# Patient Record
Sex: Male | Born: 1995 | Race: White | Hispanic: No | Marital: Single | Smoking: Current some day smoker
Health system: Southern US, Community
[De-identification: ages and names within clinical notes are randomized; demographics above are authoritative.]

## PROBLEM LIST (undated history)

## (undated) DIAGNOSIS — Z87442 Personal history of urinary calculi: Secondary | ICD-10-CM

## (undated) DIAGNOSIS — R3129 Other microscopic hematuria: Secondary | ICD-10-CM

## (undated) DIAGNOSIS — N451 Epididymitis: Secondary | ICD-10-CM

## (undated) HISTORY — DX: Epididymitis: N45.1

## (undated) HISTORY — DX: Personal history of urinary calculi: Z87.442

---
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
## 2014-02-03 ENCOUNTER — Emergency Department: Payer: Self-pay | Admitting: Emergency Medicine

## 2014-02-03 LAB — URINALYSIS, COMPLETE
Bacteria: NONE SEEN
Bilirubin,UR: NEGATIVE
Glucose,UR: NEGATIVE mg/dL (ref 0–75)
Ketone: NEGATIVE
Leukocyte Esterase: NEGATIVE
Nitrite: NEGATIVE
PROTEIN: NEGATIVE
Ph: 6 (ref 4.5–8.0)
RBC,UR: 16 /HPF (ref 0–5)
SPECIFIC GRAVITY: 1.019 (ref 1.003–1.030)

## 2014-08-19 ENCOUNTER — Encounter: Payer: Self-pay | Admitting: *Deleted

## 2014-08-19 ENCOUNTER — Ambulatory Visit
Admission: RE | Admit: 2014-08-19 | Discharge: 2014-08-19 | Disposition: A | Discharge status: Home / Self Care | Payer: BLUE CROSS/BLUE SHIELD | Source: Ambulatory Visit | Attending: Urology | Admitting: Urology

## 2014-08-19 ENCOUNTER — Ambulatory Visit (INDEPENDENT_AMBULATORY_CARE_PROVIDER_SITE_OTHER): Payer: BLUE CROSS/BLUE SHIELD | Admitting: Urology

## 2014-08-19 ENCOUNTER — Telehealth: Payer: Self-pay | Admitting: Urology

## 2014-08-19 VITALS — BP 97/69 | HR 81 | Ht 66.0 in | Wt 166.3 lb

## 2014-08-19 DIAGNOSIS — R109 Unspecified abdominal pain: Secondary | ICD-10-CM | POA: Insufficient documentation

## 2014-08-19 DIAGNOSIS — R3129 Other microscopic hematuria: Secondary | ICD-10-CM

## 2014-08-19 DIAGNOSIS — Z87442 Personal history of urinary calculi: Secondary | ICD-10-CM | POA: Diagnosis not present

## 2014-08-19 DIAGNOSIS — R312 Other microscopic hematuria: Secondary | ICD-10-CM | POA: Diagnosis not present

## 2014-08-19 DIAGNOSIS — N200 Calculus of kidney: Secondary | ICD-10-CM | POA: Insufficient documentation

## 2014-08-19 LAB — URINALYSIS, COMPLETE
Bilirubin, UA: NEGATIVE
GLUCOSE, UA: NEGATIVE
KETONES UA: NEGATIVE
LEUKOCYTES UA: NEGATIVE
Nitrite, UA: NEGATIVE
PROTEIN UA: NEGATIVE
Specific Gravity, UA: 1.02 (ref 1.005–1.030)
UUROB: 0.2 mg/dL (ref 0.2–1.0)
pH, UA: 7 (ref 5.0–7.5)

## 2014-08-19 LAB — MICROSCOPIC EXAMINATION: BACTERIA UA: NONE SEEN

## 2014-08-19 MED ORDER — TAMSULOSIN HCL 0.4 MG PO CAPS: 0.4000 mg | ORAL_CAPSULE | Freq: Every day | ORAL | Status: DC

## 2014-08-19 NOTE — Telephone Encounter (Signed)
Please call patient and tell him his KUB is negative for stone.  Please schedule RUS.

## 2014-08-19 NOTE — Progress Notes (Signed)
08/19/2014 1:00 PM   DOCTOR SHEAHAN 1995/10/04 [id number]  Referring provider: No referring provider defined for this encounter.  Chief Complaint  Patient presents with  . Nephrolithiasis    3 day pain right flank, vomitting    HPI: Mr. Isaiah Tucker is a 19 y/o white male with a h/o kidney stones who presents today after a two day hx of right flank pain and intermittent vomiting.  Patient states he was driving back from [facility] on Saturday (08/18/2014) and had the sudden onset of right flank pain.  The pain was so intense that he had to pull over to vomit.  The rest of the day he had flank pain 7 times, lasting 20-30 minutes at a time with associated vomiting.  Yesterday, he had flank pain 3-4 times, with the flank pain lasted 20-30 minutes.  He vomited twice.  He denies any gross hematuria, but he states he is always told he has blood in his urine.  His urine is orange today.  His UA has 11-30 RBC's per hpf today.  His pain does not radiate.  He has h/o stones, but he has not sought urological evaluation in the past.  His father has passed about thirty stones.  He has not had any associated fevers, chills, nausea or vomiting.     PMH: Past Medical History  Diagnosis Date  . History of kidney stones   . Epididymitis     Surgical History: No past surgical history on file.  Home Medications:    Medication List       This list is accurate as of: 08/19/14  1:00 PM.  Always use your most recent med list.               ketorolac 10 MG tablet  Commonly known as:  TORADOL  Take 10 mg by mouth every 6 (six) hours as needed.     tamsulosin 0.4 MG Caps capsule  Commonly known as:  FLOMAX  Take 1 capsule (0.4 mg total) by mouth daily.        Allergies: No Known Allergies  Family History: Family History  Problem Relation Age of Onset  . Kidney Stones Father     Social History:  reports that he has quit smoking. He does not have any smokeless tobacco history on file. He  reports that he drinks alcohol. He reports that he does not use illicit drugs.  ROS: Urological Symptom Review  Patient is experiencing the following symptoms: Burning/pain with urination Blood in urine   Review of Systems  Gastrointestinal (upper)  : Vomiting  Gastrointestinal (lower) : Negative for lower GI symptoms  Constitutional : Negative for symptoms  Skin: Negative for skin symptoms  Eyes: Negative for eye symptoms  Ear/Nose/Throat : Negative for Ear/Nose/Throat symptoms  Hematologic/Lymphatic: Negative for Hematologic/Lymphatic symptoms  Cardiovascular : Negative for cardiovascular symptoms  Respiratory : Negative for respiratory symptoms  Endocrine: Negative for endocrine symptoms  Musculoskeletal: Back pain  Neurological: Negative for neurological symptoms  Psychologic: Negative for psychiatric symptoms   Physical Exam: BP 97/69 mmHg  Pulse 81  Ht _0  (1.676 m)  Wt 166 lb 4.8 oz (75.433 kg)  BMI 26.85 kg/m2  Constitutional:  Alert and oriented, No acute distress. HEENT: [place] AT, moist mucus membranes.  Trachea midline, no masses. Cardiovascular: No clubbing, cyanosis, or edema. Respiratory: Normal respiratory effort, no increased work of breathing. GI: Abdomen is soft, nontender, nondistended, no abdominal masses GU: No CVA tenderness. GU: Patient with circumcised phallus.  Urethral meatus is patent.  No penile discharge. No penile lesions or rashes. Scrotum without lesions, cysts, rashes and/or edema.  Testicles are located scrotally bilaterally. No masses are appreciated in the testicles. Left and right epididymis are normal. Skin: No rashes, bruises or suspicious lesions. Lymph: No cervical or inguinal adenopathy. Neurologic: Grossly intact, no focal deficits, moving all 4 extremities. Psychiatric: Normal mood and affect.  Laboratory Data: Results for orders placed or performed in visit on 08/19/14  Microscopic Examination  Result  Value Ref Range   WBC, UA 0-5 0 -  5 /hpf   RBC, UA 11-30 (A) 0 -  2 /hpf   Epithelial Cells (non renal) 0-10 0 - 10 /hpf   Mucus, UA Present (A) Not Estab.   Bacteria, UA None seen None seen/Few  Urinalysis, Complete  Result Value Ref Range   Specific Gravity, UA 1.020 1.005 - 1.030   pH, UA 7.0 5.0 - 7.5   Color, UA Yellow Yellow   Appearance Ur Clear Clear   Leukocytes, UA Negative Negative   Protein, UA Negative Negative/Trace   Glucose, UA Negative Negative   Ketones, UA Negative Negative   RBC, UA 2+ (A) Negative   Bilirubin, UA Negative Negative   Urobilinogen, Ur 0.2 0.2 - 1.0 mg/dL   Nitrite, UA Negative Negative   Microscopic Examination See below:     No results found for: WBC, HGB, HCT, MCV, PLT  No results found for: CREATININE  No results found for: PSA  No results found for: TESTOSTERONE  No results found for: HGBA1C  Urinalysis No results found for: COLORURINE, APPEARANCEUR, LABSPEC, PHURINE, GLUCOSEU, HGBUR, BILIRUBINUR, KETONESUR, PROTEINUR, UROBILINOGEN, NITRITE, LEUKOCYTESUR  Pertinent Imaging: CLINICAL DATA: 19 year old male with right back pain for 1 week. History kidney stones. Initial encounter.  EXAM: ABDOMEN - 1 VIEW  COMPARISON: None.  FINDINGS: No discrete renal or ureteral calculi is noted. Overlying stool slightly limits evaluation.  Minimal curvature lumbar spine convex to the right.  Normal bowel gas pattern.  IMPRESSION: No discrete renal or ureteral calculi is noted. Overlying stool slightly limits evaluation.   Electronically Signed  By: Genia Del M.D.  On: 08/19/2014 13:31  Assessment & Plan:    1. Flank pain-  Patient with the sudden onset of right flank pain with a h/o stones.  He will have a KUB today and I will call him with the results. I have also prescribed him tamsulosin 0.4 mg one capsule daily to assist with MET and a strainer and encouraged him to strain his urine and to bring in any  fragments he may pass.  If KUB is negative, we will schedule a RUS to look for hydronephrosis.    2.  Microscopic hematuria-  Patient had 11-30 RBC's/hpf on today's UA associated with flank pain.  We will continue to monitor his UA to make sure the micro heme resolved after he has passed his stone.    3. History of stones-  Patient would benefit from a metabolic work up after this stone has passed due to his h/o of stones and his father's h/o stones.     - Urinalysis, Complete - CULTURE, URINE COMPREHENSIVE   No Follow-up on file.  Zara Council, [facility] [address] [place], [place] [address] [phone]

## 2014-08-21 LAB — CULTURE, URINE COMPREHENSIVE

## 2014-08-22 NOTE — Telephone Encounter (Signed)
Unable to reach pt via telephone. Letter was sent in reference to needing RUS. Cw,lpn

## 2014-09-03 ENCOUNTER — Other Ambulatory Visit: Payer: Self-pay

## 2014-09-03 DIAGNOSIS — N200 Calculus of kidney: Secondary | ICD-10-CM

## 2014-09-05 ENCOUNTER — Other Ambulatory Visit: Payer: Self-pay | Admitting: Family Medicine

## 2014-09-05 DIAGNOSIS — R109 Unspecified abdominal pain: Secondary | ICD-10-CM

## 2014-09-18 ENCOUNTER — Ambulatory Visit
Admission: RE | Admit: 2014-09-18 | Discharge: 2014-09-18 | Disposition: A | Discharge status: Home / Self Care | Payer: BLUE CROSS/BLUE SHIELD | Source: Ambulatory Visit | Attending: Urology | Admitting: Urology

## 2014-09-18 DIAGNOSIS — N200 Calculus of kidney: Secondary | ICD-10-CM | POA: Diagnosis not present

## 2014-09-18 DIAGNOSIS — R109 Unspecified abdominal pain: Secondary | ICD-10-CM | POA: Diagnosis present

## 2014-09-20 ENCOUNTER — Telehealth: Payer: Self-pay

## 2014-09-20 DIAGNOSIS — N200 Calculus of kidney: Secondary | ICD-10-CM

## 2014-09-20 NOTE — Telephone Encounter (Signed)
-----   Message from Vanna ScotlandAshley Brandon, MD sent at 09/18/2014  5:28 PM EDT ----- Please let patient know that he has 6 mm left nonobstructing stone.  This should no be causing pain but could possibly be treated to avoid stone episodes in the future.  Please either arrange follow up in the next few weeks vs. 1 year with KUB to monitor.    Vanna ScotlandAshley Brandon, MD

## 2014-09-20 NOTE — Telephone Encounter (Signed)
Spoke with pt who stated he would like to have stone treated. Pt was transferred to the front to make f/u appt. KUB orders were placed. Cw,lpn

## 2014-10-03 ENCOUNTER — Ambulatory Visit
Admission: RE | Admit: 2014-10-03 | Discharge: 2014-10-03 | Disposition: A | Discharge status: Home / Self Care | Payer: BLUE CROSS/BLUE SHIELD | Source: Ambulatory Visit | Attending: Urology | Admitting: Urology

## 2014-10-03 ENCOUNTER — Ambulatory Visit (INDEPENDENT_AMBULATORY_CARE_PROVIDER_SITE_OTHER): Payer: BLUE CROSS/BLUE SHIELD | Admitting: Urology

## 2014-10-03 ENCOUNTER — Encounter: Payer: Self-pay | Admitting: Urology

## 2014-10-03 VITALS — BP 105/72 | HR 65 | Ht 66.0 in | Wt 163.9 lb

## 2014-10-03 DIAGNOSIS — N200 Calculus of kidney: Secondary | ICD-10-CM

## 2014-10-03 DIAGNOSIS — R109 Unspecified abdominal pain: Secondary | ICD-10-CM | POA: Diagnosis not present

## 2014-10-03 DIAGNOSIS — R312 Other microscopic hematuria: Secondary | ICD-10-CM

## 2014-10-03 DIAGNOSIS — R3129 Other microscopic hematuria: Secondary | ICD-10-CM

## 2014-10-03 LAB — URINALYSIS, COMPLETE
Bilirubin, UA: NEGATIVE
Glucose, UA: NEGATIVE
Ketones, UA: NEGATIVE
LEUKOCYTES UA: NEGATIVE
Nitrite, UA: NEGATIVE
PH UA: 6 (ref 5.0–7.5)
Protein, UA: NEGATIVE
Specific Gravity, UA: 1.025 (ref 1.005–1.030)
Urobilinogen, Ur: 0.2 mg/dL (ref 0.2–1.0)

## 2014-10-03 LAB — MICROSCOPIC EXAMINATION
Bacteria, UA: NONE SEEN
Epithelial Cells (non renal): NONE SEEN /hpf (ref 0–10)

## 2014-10-03 NOTE — Progress Notes (Signed)
11:21 PM   Isaiah Tucker 1995-10-29 [id number]  Referring provider: No referring provider defined for this encounter.  Chief Complaint  Patient presents with  . Nephrolithiasis    Follow up- patient has no complains/symptoms       HPI: Isaiah Tucker is a 89 year white male with a h/o nephrolithiasis who presents today to discuss the results of his renal ultrasound.    Today, he is without complaint.  He denies any flank pain, hematuria or dysuria.  He also denies any fevers, chills, nausea or vomiting.  RUS demonstrate a 6 mm right renal stone.  This stone was not visible on the KUB dated 08/19/2014.  I have reviewed the films with the patient.  Previous History:  Patient states he was driving back from [place] on Saturday (08/18/2014) and had the sudden onset of right flank pain.  The pain was so intense that he had to pull over to vomit.  The rest of the day he had flank pain 7 times, lasting 20-30 minutes at a time with associated vomiting.  Yesterday, he had flank pain 3-4 times, with the flank pain lasted 20-30 minutes.  He vomited twice.  He denies any gross hematuria, but he states he is always told he has blood in his urine.  His UA on 08/19/2014 had 11-30 RBC's per hpf today.  His pain does not radiate.  He has h/o stones, but he has not sought urological evaluation in the past.  His father has passed about thirty stones.  He has not had any associated fevers, chills, nausea or vomiting.  KUB did not demonstrate any renal or ureteral calculus.      PMH: Past Medical History  Diagnosis Date  . History of kidney stones   . Epididymitis     Surgical History: No past surgical history on file.  Home Medications:    Medication List       This list is accurate as of: 10/03/14 11:21 PM.  Always use your most recent med list.               ketorolac 10 MG tablet  Commonly known as:  TORADOL  Take 10 mg by mouth every 6 (six) hours as needed.     tamsulosin 0.4 MG  Caps capsule  Commonly known as:  FLOMAX  Take 1 capsule (0.4 mg total) by mouth daily.        Allergies: No Known Allergies  Family History: Family History  Problem Relation Age of Onset  . Kidney Stones Father     Social History:  reports that he has been smoking.  He has never used smokeless tobacco. He reports that he drinks alcohol. He reports that he does not use illicit drugs.  ROS: Urological Symptom Review  Patient is experiencing the following symptoms: Burning/pain with urination Blood in urine   Review of Systems  Gastrointestinal (upper)  : Vomiting  Gastrointestinal (lower) : Negative for lower GI symptoms  Constitutional : Negative for symptoms  Skin: Negative for skin symptoms  Eyes: Negative for eye symptoms  Ear/Nose/Throat : Negative for Ear/Nose/Throat symptoms  Hematologic/Lymphatic: Negative for Hematologic/Lymphatic symptoms  Cardiovascular : Negative for cardiovascular symptoms  Respiratory : Negative for respiratory symptoms  Endocrine: Negative for endocrine symptoms  Musculoskeletal: Back pain  Neurological: Negative for neurological symptoms  Psychologic: Negative for psychiatric symptoms   Physical Exam: Blood pressure 105/72, pulse 65, height 5\' 6"  (1.676 m), weight 163 lb 14.4 oz (74.345 kg).  Laboratory Data: Results for orders placed or performed in visit on 10/03/14  Microscopic Examination  Result Value Ref Range   WBC, UA 0-5 0 -  5 /hpf   RBC, UA 3-10 (A) 0 -  2 /hpf   Epithelial Cells (non renal) None seen 0 - 10 /hpf   Mucus, UA Present (A) Not Estab.   Bacteria, UA None seen None seen/Few  Urinalysis, Complete  Result Value Ref Range   Specific Gravity, UA 1.025 1.005 - 1.030   pH, UA 6.0 5.0 - 7.5   Color, UA Yellow Yellow   Appearance Ur Clear Clear   Leukocytes, UA Negative Negative   Protein, UA Negative Negative/Trace   Glucose, UA Negative Negative   Ketones, UA Negative Negative    RBC, UA 1+ (A) Negative   Bilirubin, UA Negative Negative   Urobilinogen, Ur 0.2 0.2 - 1.0 mg/dL   Nitrite, UA Negative Negative   Microscopic Examination See below:     No results found for: WBC, HGB, HCT, MCV, PLT  No results found for: CREATININE  No results found for: PSA  No results found for: TESTOSTERONE  No results found for: HGBA1C  Urinalysis    Component Value Date/Time   COLORURINE Yellow 02/03/2014 2243   APPEARANCEUR Clear 02/03/2014 2243   LABSPEC 1.019 02/03/2014 2243   PHURINE 6.0 02/03/2014 2243   GLUCOSEU Negative 10/03/2014 1131   GLUCOSEU Negative 02/03/2014 2243   HGBUR 2+ 02/03/2014 2243   BILIRUBINUR Negative 10/03/2014 1131   BILIRUBINUR Negative 02/03/2014 2243   KETONESUR Negative 02/03/2014 2243   PROTEINUR Negative 02/03/2014 2243   NITRITE Negative 10/03/2014 1131   NITRITE Negative 02/03/2014 2243   LEUKOCYTESUR Negative 10/03/2014 1131   LEUKOCYTESUR Negative 02/03/2014 2243    Pertinent Imaging: CLINICAL DATA: Right flank pain x1 half months  EXAM: RENAL / URINARY TRACT ULTRASOUND COMPLETE  COMPARISON: None.  FINDINGS: Right Kidney:  Length: 10.0 cm. No mass or hydronephrosis.  Left Kidney:  Length: 9.1 cm. 6 mm lower pole renal calculus. No hydronephrosis.  Bladder:  Within normal limits.  IMPRESSION: 6 mm nonobstructing left lower pole renal calculus.  No hydronephrosis.   Electronically Signed  By: Charline Bills M.D.  On: 09/18/2014 15:18  Assessment & Plan:    1. Flank pain-  Patient's flank pain has abated.  He is wanting definitive treatment for the remaining stone in his right kidney.  We discussed ESWL and URS/LL/stent placement.  He does not want surgery.  We will obtain another KUB in 2 weeks to see if the stone is visible on a new study.  I explained to him the ESWL risks/benefits.  I emphasized to him that having ESWL would not necessarily prevent him from having surgery.  I  explained to him the stone fragments may become lodged in his ureter and he would need placement of an ureteral stent for urgent decompression of his kidney.  He understood this risk and wishing to proceed.  2.  Microscopic hematuria-  Patient had 11-30 RBC's/hpf on 08/19/2014 and 3-10 RBC's/hpf on today's UA associated with a right renal stone.  We will continue to monitor his UA to make sure the micro heme resolved after he has passed his stone.    3. History of stones-  Patient would benefit from a metabolic work up after this stone has passed due to his h/o of stones and his father's h/o stones.     - Urinalysis, Complete - CULTURE, URINE COMPREHENSIVE   Return  in about 2 weeks (around 10/17/2014) for KUB and OV.  Michiel Cowboy, PA-C  [facility] [address] [place], [place] [address] [phone]

## 2014-10-23 ENCOUNTER — Encounter: Payer: Self-pay | Admitting: Urology

## 2014-10-23 ENCOUNTER — Ambulatory Visit: Payer: BLUE CROSS/BLUE SHIELD | Admitting: Urology

## 2014-10-28 ENCOUNTER — Encounter: Payer: Self-pay | Admitting: Podiatry

## 2014-10-28 ENCOUNTER — Ambulatory Visit (INDEPENDENT_AMBULATORY_CARE_PROVIDER_SITE_OTHER): Payer: BLUE CROSS/BLUE SHIELD

## 2014-10-28 ENCOUNTER — Ambulatory Visit (INDEPENDENT_AMBULATORY_CARE_PROVIDER_SITE_OTHER): Payer: BLUE CROSS/BLUE SHIELD | Admitting: Podiatry

## 2014-10-28 VITALS — BP 102/69 | HR 72 | Resp 12

## 2014-10-28 DIAGNOSIS — M2142 Flat foot [pes planus] (acquired), left foot: Principal | ICD-10-CM

## 2014-10-28 DIAGNOSIS — M722 Plantar fascial fibromatosis: Secondary | ICD-10-CM

## 2014-10-28 DIAGNOSIS — M2141 Flat foot [pes planus] (acquired), right foot: Secondary | ICD-10-CM | POA: Diagnosis not present

## 2014-10-28 NOTE — Progress Notes (Signed)
He presents today with a chief complaint of painful bilateral feet. He states that it causes his knees to hurt as well as his back to her. He works at a [employer]. He states they've been bothersome for quite some time resulting in back pain and leg pain and he's done nothing to try to help it.  Objective: 19 year old healthy male no acute distress. Vital signs are stable alert and oriented 3. Pulses are palpable bilateral. Neurologic sensorium is intact per Semmes-Weinstein monofilament. Deep tendon reflexes are intact bilateral muscle strength +5 over 5 dorsiflexion plantar flexors and inverters everters onto the musculature is intact. 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 Radiographs 3 views taken in the office today demonstrate severe pes planus no other osseous abnormalities no coalitions. Orthopedic evaluation demonstrates flexible pes planus on physical exam with gastroc equinus no reproducible pain. Cutaneous evaluation demonstrates supple well-hydrated cutis no erythema edema cellulitis drainage or odor.  Assessment: Healthy 19 year old male with flat feet and gastroc equinus resulting in knee and back pain.  Plan: Discussed etiology pathology conservative versus surgical therapies. He was scanned today for set of orthotics. I placed heel raises on these orthotics. We discussed the use of ibuprofen 400 600 or 800 mg 3 times a day with food. I will follow-up with him once his orthotics come in.

## 2014-12-02 ENCOUNTER — Encounter (INDEPENDENT_AMBULATORY_CARE_PROVIDER_SITE_OTHER): Payer: BLUE CROSS/BLUE SHIELD | Admitting: Podiatry

## 2014-12-02 NOTE — Progress Notes (Signed)
This encounter was created in error - please disregard.

## 2015-04-16 ENCOUNTER — Ambulatory Visit: Payer: BLUE CROSS/BLUE SHIELD | Admitting: Podiatry

## 2015-10-31 IMAGING — US US RENAL
1 series · 14 of 25 positions shown · non-contrast
Comparison: None.

CLINICAL DATA: Right flank pain x1 half months

EXAM:
RENAL / URINARY TRACT ULTRASOUND COMPLETE

[Series 1: us renal · 0.28mm/px · 14 of 34 slices shown]
[im 1/34]
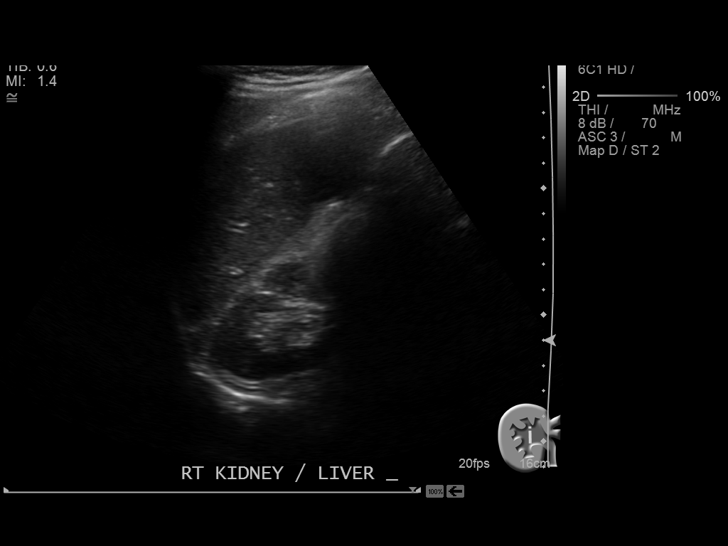
[im 3/34]
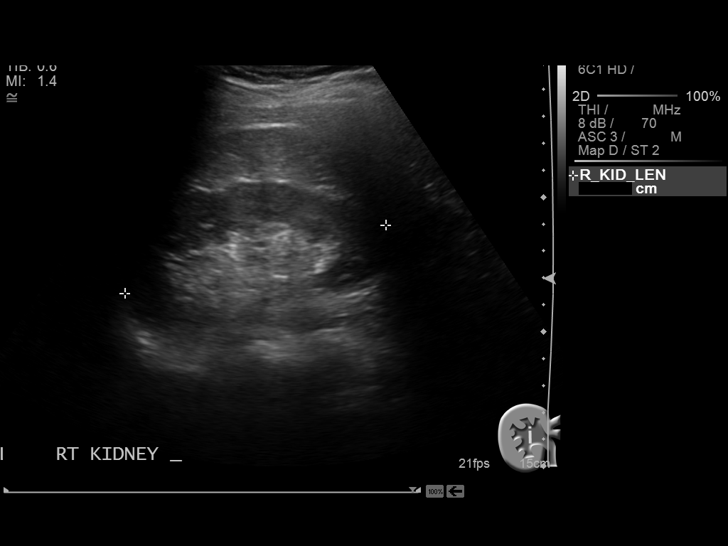
[im 6/34]
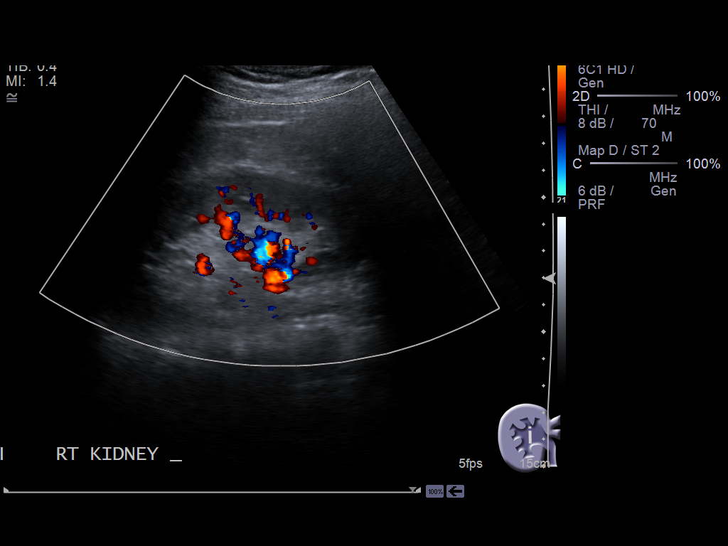
[im 9/34]
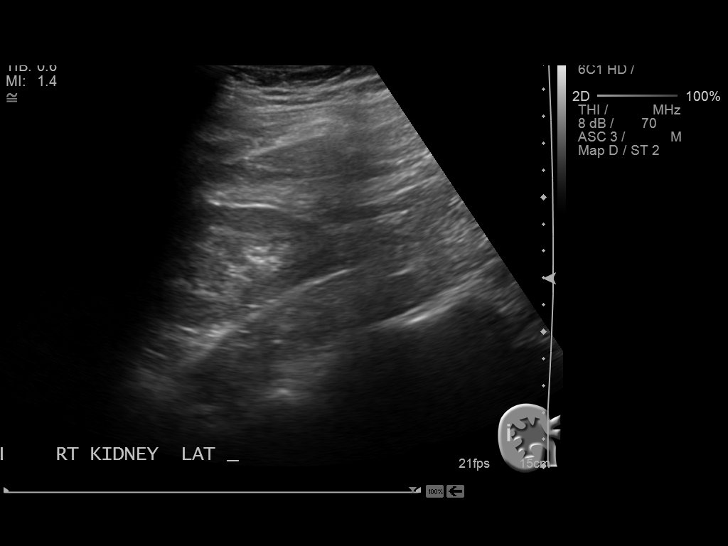
[im 12/34]
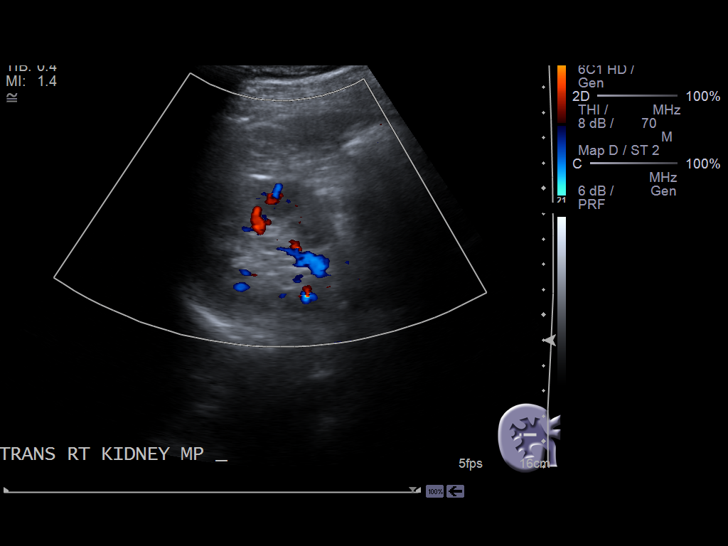
[im 13/34]
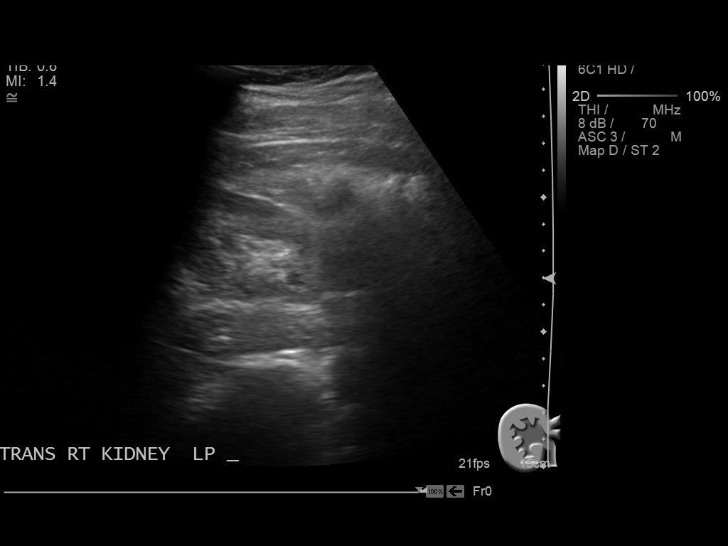
[im 16/34]
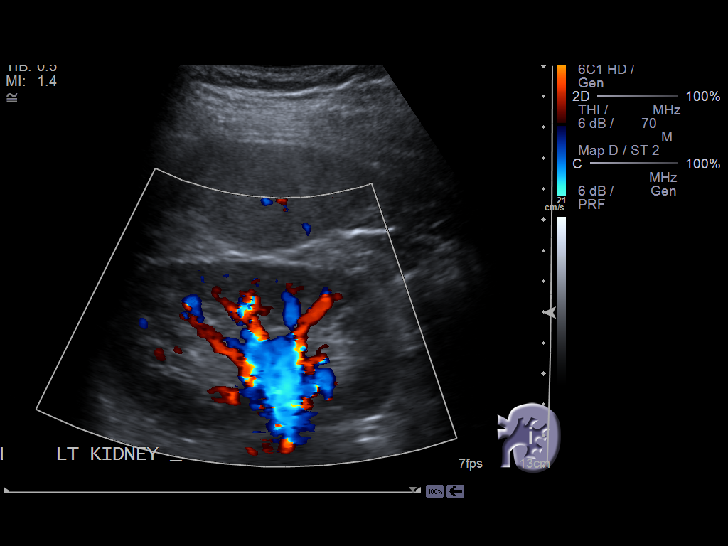
[im 18/34]
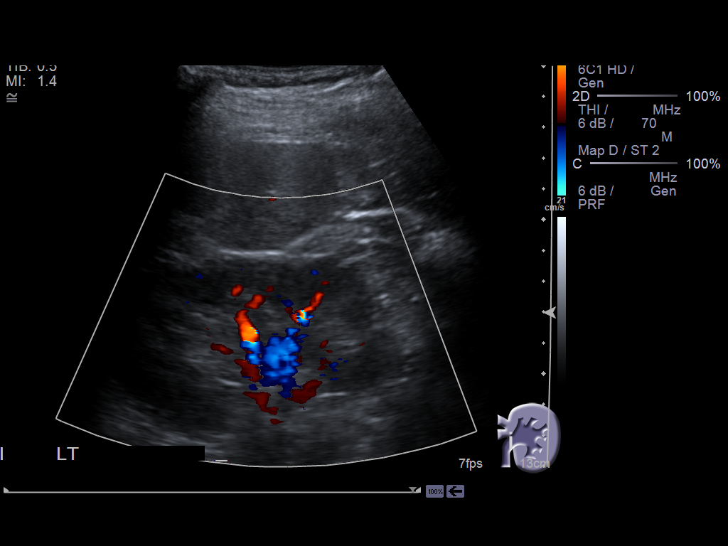
[im 21/34]
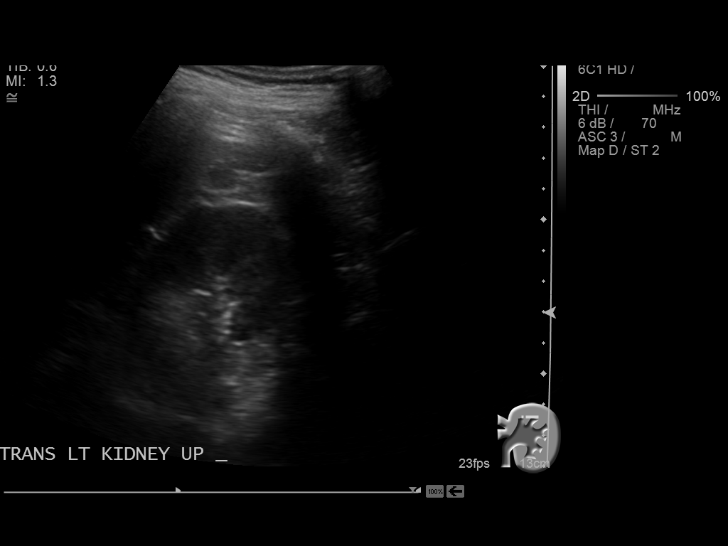
[im 23/34]
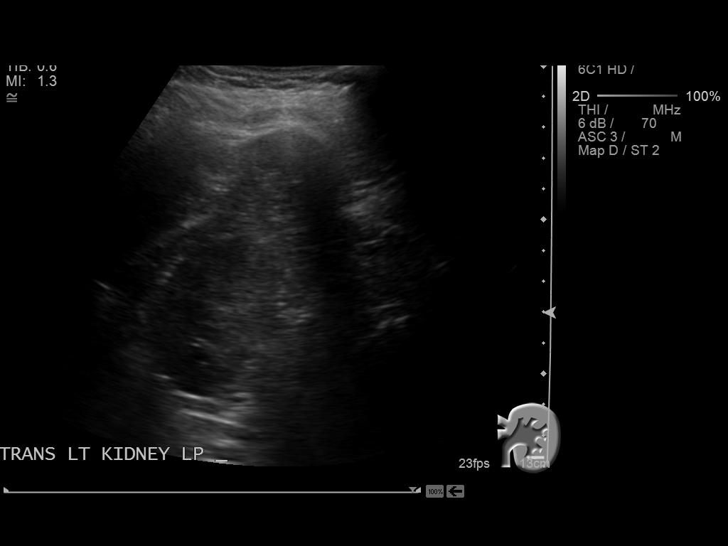
[im 25/34]
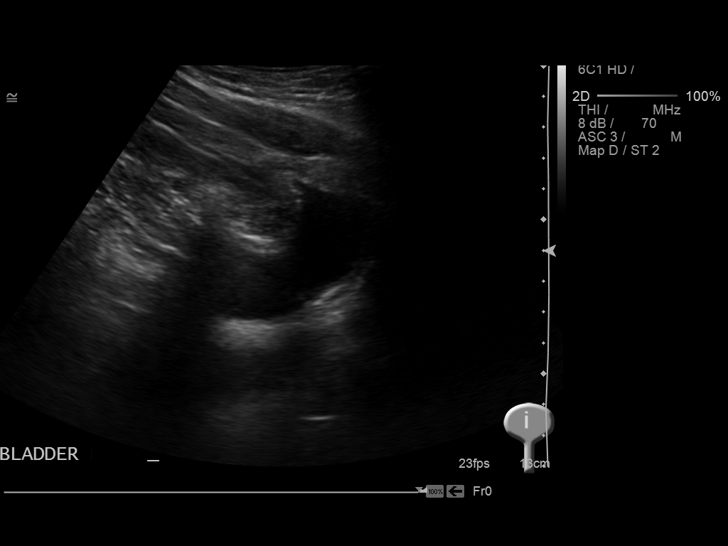
[im 28/34]
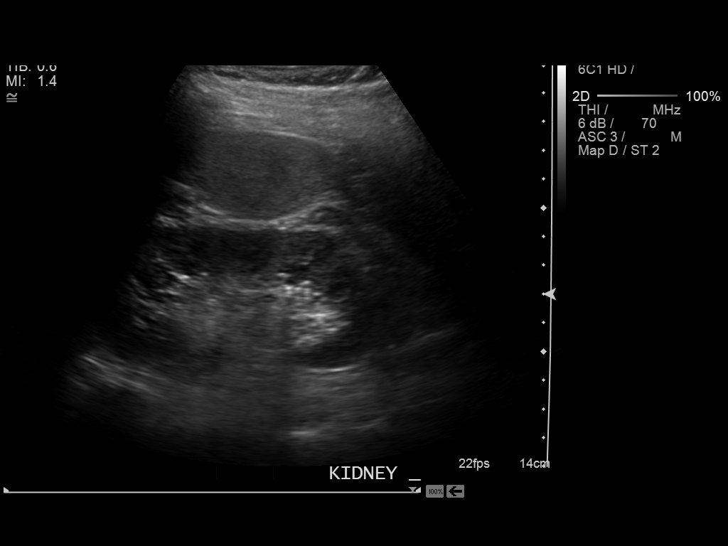
[im 31/34]
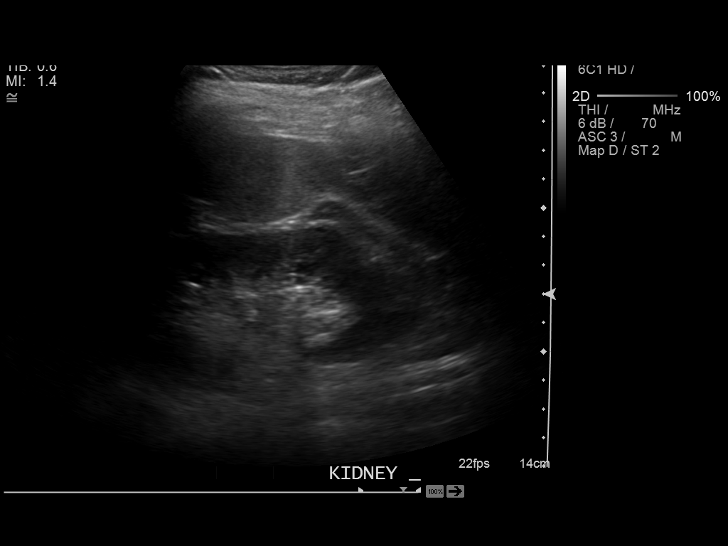
[im 34/34]
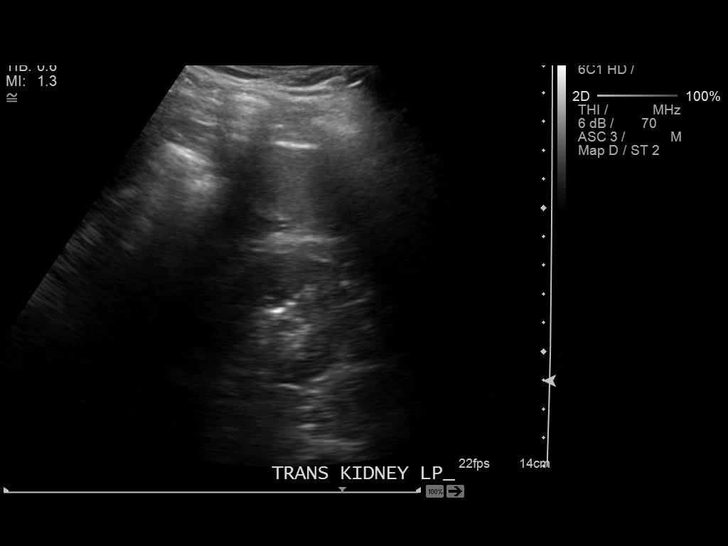

[14 of 25 positions shown; findings below may reference images not displayed]

FINDINGS: Right Kidney:

Length: 10.0 cm.  No mass or hydronephrosis.

Left Kidney:

Length: 9.1 cm.  6 mm lower pole renal calculus.  No hydronephrosis.

Bladder:

Within normal limits.
IMPRESSION: 6 mm nonobstructing left lower pole renal calculus.

No hydronephrosis.

## 2016-01-26 ENCOUNTER — Other Ambulatory Visit: Payer: Self-pay | Admitting: Family Medicine

## 2016-01-26 ENCOUNTER — Ambulatory Visit
Admission: RE | Admit: 2016-01-26 | Discharge: 2016-01-26 | Disposition: A | Discharge status: Home / Self Care | Payer: BLUE CROSS/BLUE SHIELD | Source: Ambulatory Visit | Attending: Family Medicine | Admitting: Family Medicine

## 2016-01-26 DIAGNOSIS — N50812 Left testicular pain: Secondary | ICD-10-CM | POA: Insufficient documentation

## 2016-01-26 DIAGNOSIS — I861 Scrotal varices: Secondary | ICD-10-CM | POA: Insufficient documentation

## 2016-01-26 DIAGNOSIS — N433 Hydrocele, unspecified: Secondary | ICD-10-CM | POA: Insufficient documentation

## 2016-05-04 ENCOUNTER — Ambulatory Visit (INDEPENDENT_AMBULATORY_CARE_PROVIDER_SITE_OTHER): Payer: BLUE CROSS/BLUE SHIELD | Admitting: Podiatry

## 2016-05-04 ENCOUNTER — Encounter: Payer: Self-pay | Admitting: Podiatry

## 2016-05-04 ENCOUNTER — Ambulatory Visit (INDEPENDENT_AMBULATORY_CARE_PROVIDER_SITE_OTHER): Payer: BLUE CROSS/BLUE SHIELD

## 2016-05-04 DIAGNOSIS — M79671 Pain in right foot: Secondary | ICD-10-CM

## 2016-05-04 DIAGNOSIS — R52 Pain, unspecified: Secondary | ICD-10-CM

## 2016-05-04 DIAGNOSIS — M76811 Anterior tibial syndrome, right leg: Secondary | ICD-10-CM

## 2016-05-04 DIAGNOSIS — T79A21A Traumatic compartment syndrome of right lower extremity, initial encounter: Secondary | ICD-10-CM

## 2016-05-14 MED ORDER — BETAMETHASONE SOD PHOS & ACET 6 (3-3) MG/ML IJ SUSP: 3.0000 mg | Freq: Once | INTRAMUSCULAR | Status: DC

## 2016-05-14 NOTE — Progress Notes (Signed)
   Subjective:  Patient presents today for evaluation of right foot pain is been going on for approximately 2 weeks. Patient states that he has pain after standing for long periods of time and after sitting for long time when he stands up. Patient states that he does have flatfeet. 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     Objective/Physical Exam General: The patient is alert and oriented x3 in no acute distress.  Dermatology: Skin is warm, dry and supple bilateral lower extremities. Negative for open lesions or macerations.  Vascular: Palpable pedal pulses bilaterally. No edema or erythema noted. Capillary refill within normal limits.  Neurological: Epicritic and protective threshold grossly intact bilaterally.   Musculoskeletal Exam: Pain on palpation noted to the anterior aspect of the patient's right ankle joint. Pain with forced dorsiflexion of the ankle joint and dorsiflexion of the digits consistent with a anterior compartment tendinitis right lower extremity.   Radiographic Exam:  Normal osseous mineralization. Joint spaces preserved. No fracture/dislocation/boney destruction.    Assessment: #1 anterior compartment tendinitis right lower extremity   Plan of Care:  #1 Patient was evaluated. #2 injection of 0.5 mL Celestone Soluspan injected in the anterior compartment of the right lower extremity at the level of the ankle joint. #3 immobilization cam boot dispensed with a compression anklet #4 ankle brace dispensed #5 patient may transition from the cam boot to the ankle brace depending on pain. Patient is not able to wear the cam boot at work. Patient needs to wear the ankle brace during work hours and the immobilization cam boot as much as possible during nonworking hours. #6 return to clinic in 4 weeks   Felecia ShellingBrent M. Evans, DPM [facility]  Dr. Felecia ShellingBrent M. Evans, DPM    [address]                                        [place], [place] [address]                  Office [phone]  Fax [phone]

## 2016-06-01 ENCOUNTER — Encounter: Payer: Self-pay | Admitting: Podiatry

## 2016-06-01 ENCOUNTER — Ambulatory Visit (INDEPENDENT_AMBULATORY_CARE_PROVIDER_SITE_OTHER): Payer: BLUE CROSS/BLUE SHIELD | Admitting: Podiatry

## 2016-06-01 DIAGNOSIS — T79A21A Traumatic compartment syndrome of right lower extremity, initial encounter: Secondary | ICD-10-CM

## 2016-06-01 DIAGNOSIS — M2141 Flat foot [pes planus] (acquired), right foot: Secondary | ICD-10-CM

## 2016-06-01 DIAGNOSIS — M2142 Flat foot [pes planus] (acquired), left foot: Secondary | ICD-10-CM

## 2016-06-01 DIAGNOSIS — M76811 Anterior tibial syndrome, right leg: Secondary | ICD-10-CM

## 2016-06-01 MED ORDER — BETAMETHASONE SOD PHOS & ACET 6 (3-3) MG/ML IJ SUSP: 3.0000 mg | Freq: Once | INTRAMUSCULAR | Status: DC

## 2016-06-01 NOTE — Progress Notes (Signed)
   Subjective:  Patient presents today for follow-up treatment and evaluation of an anterior compartment tendinitis to the right lower extremity. Patient states that he is not much better. He does notice significant improvement when wearing the cam boot. 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     Objective/Physical Exam General: The patient is alert and oriented x3 in no acute distress.  Dermatology: Skin is warm, dry and supple bilateral lower extremities. Negative for open lesions or macerations.  Vascular: Palpable pedal pulses bilaterally. No edema or erythema noted. Capillary refill within normal limits.  Neurological: Epicritic and protective threshold grossly intact bilaterally.   Musculoskeletal Exam: Pain on palpation noted to the anterior aspect of the patient's right ankle joint. Pain with forced dorsiflexion of the ankle joint and dorsiflexion of the digits consistent with a anterior compartment tendinitis right lower extremity.   Assessment: #1 anterior compartment tendinitis right lower extremity   Plan of Care:  #1 Patient was evaluated. #2 injection of 0.5 mL Celestone Soluspan injected in the anterior compartment of the right lower extremity at the level of the ankle joint. #3 continue immobilization cam boot #4 continue compression ankle sleeve #5 return to clinic in 4 weeks  The patient is not better in 4 weeks we may require an MRI to determine if there is surgical intervention is warranted.  Felecia ShellingBrent M. Trina Asch, DPM [facility]  Dr. Felecia ShellingBrent M. Brieanna Nau, DPM    [address]                                        [place], [place] [address]                Office [phone]  Fax [phone]

## 2016-06-29 ENCOUNTER — Ambulatory Visit: Payer: BLUE CROSS/BLUE SHIELD | Admitting: Podiatry

## 2017-03-09 IMAGING — US US ART/VEN ABD/PELV/SCROTUM DOPPLER LTD
1 series · 13 of 25 positions shown · non-contrast
Comparison: None.

CLINICAL DATA: 20-year-old male with left-sided testicular pain.
Initial encounter.

EXAM:
SCROTAL ULTRASOUND
DOPPLER ULTRASOUND OF THE TESTICLES
TECHNIQUE: Complete ultrasound examination of the testicles, epididymis, and
other scrotal structures was performed. Color and spectral Doppler
ultrasound were also utilized to evaluate blood flow to the
testicles.

[Series 1: us art/ven abd/pelv/scrotum doppler ltd · 0.08mm/px · 13 of 118 slices shown]
[im 1/118]
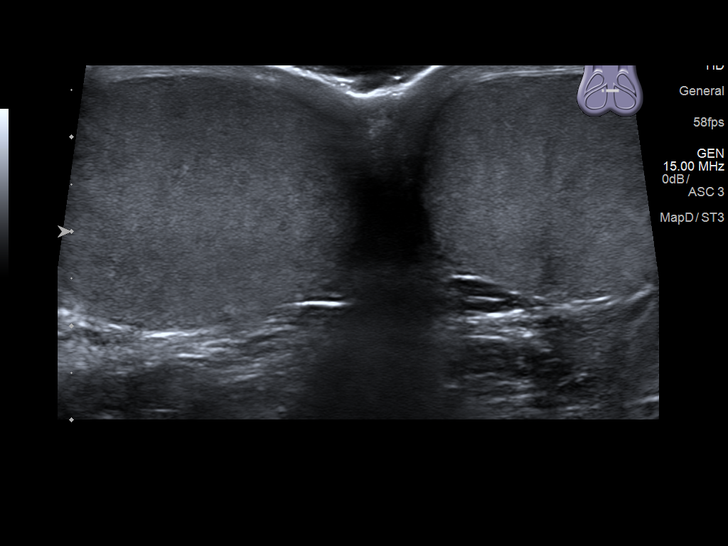
[im 10/118]
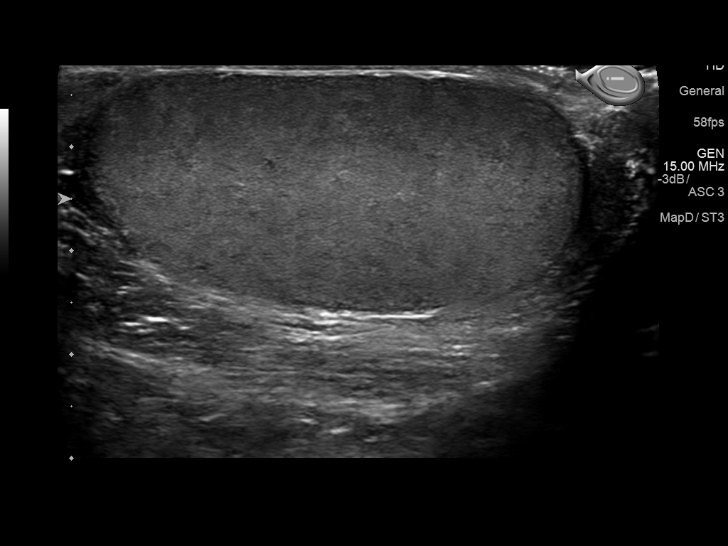
[im 20/118]
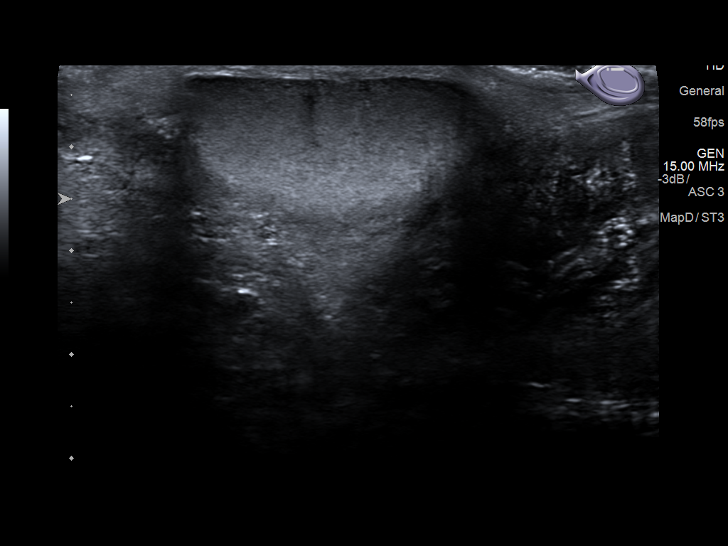
[im 30/118]
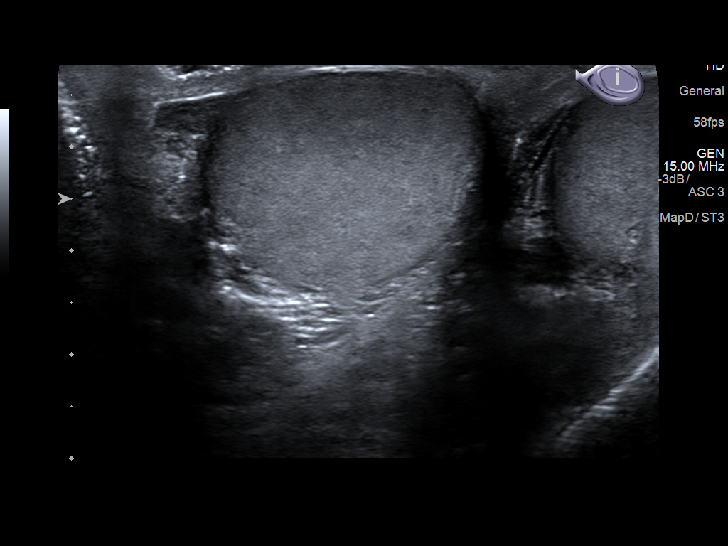
[im 40/118]
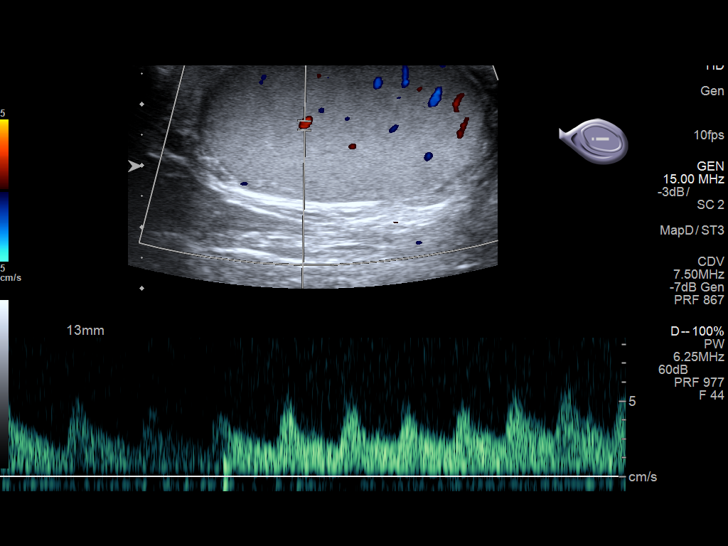
[im 49/118]
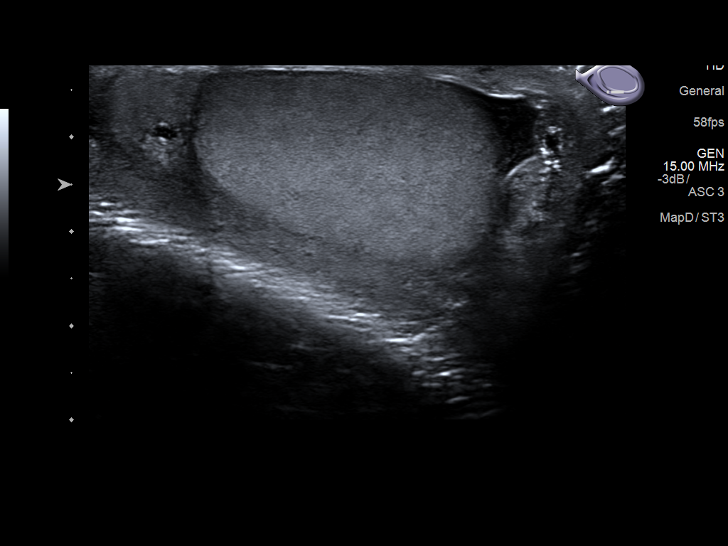
[im 59/118]
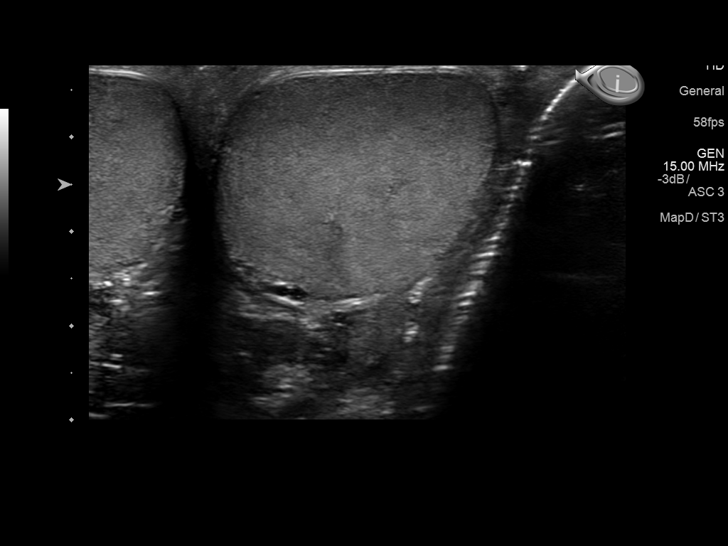
[im 69/118]
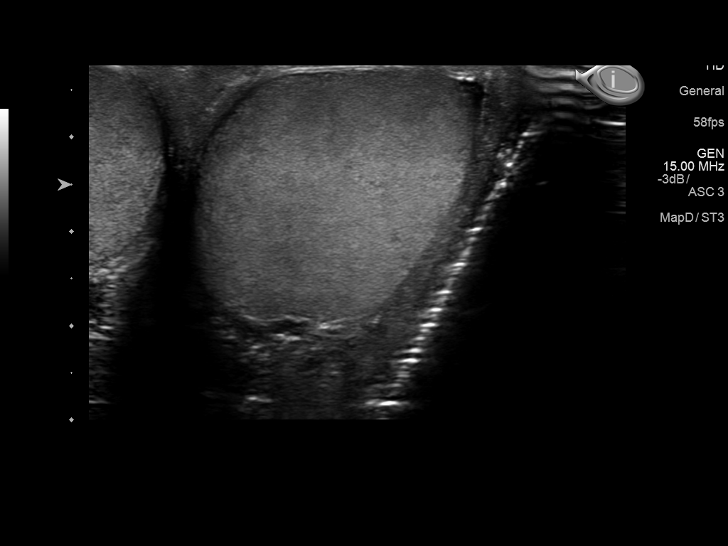
[im 79/118]
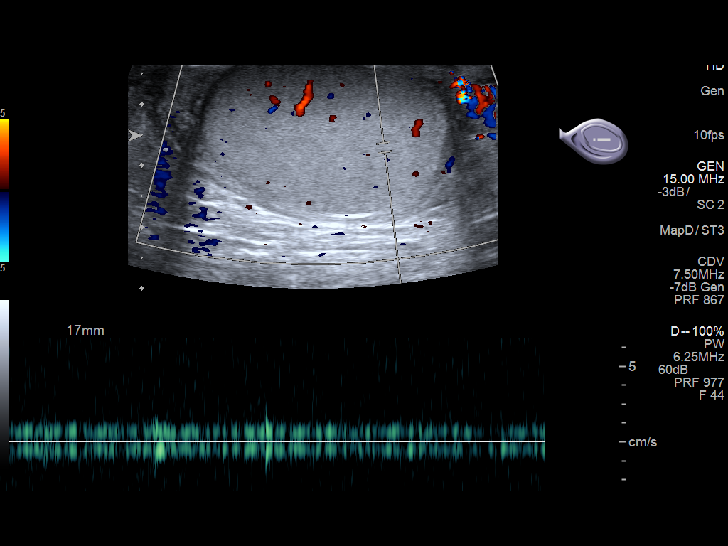
[im 88/118]
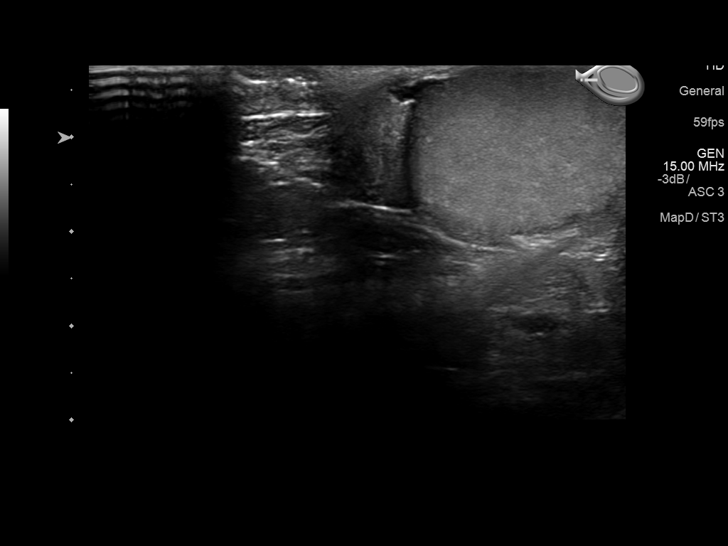
[im 98/118]
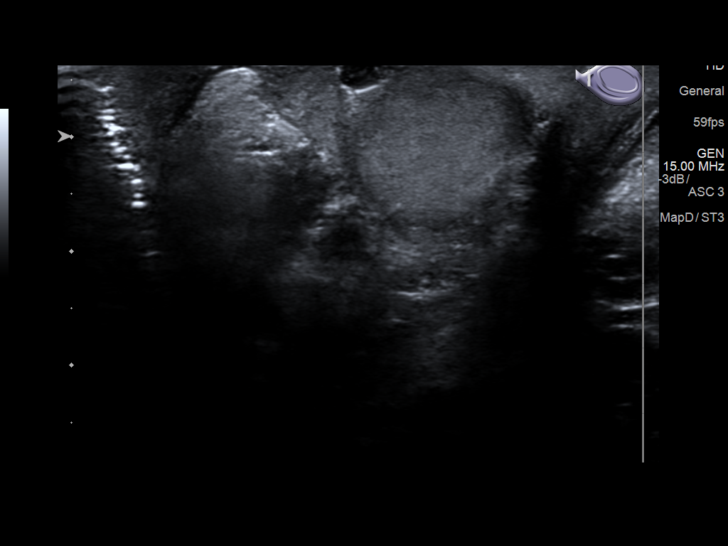
[im 108/118]
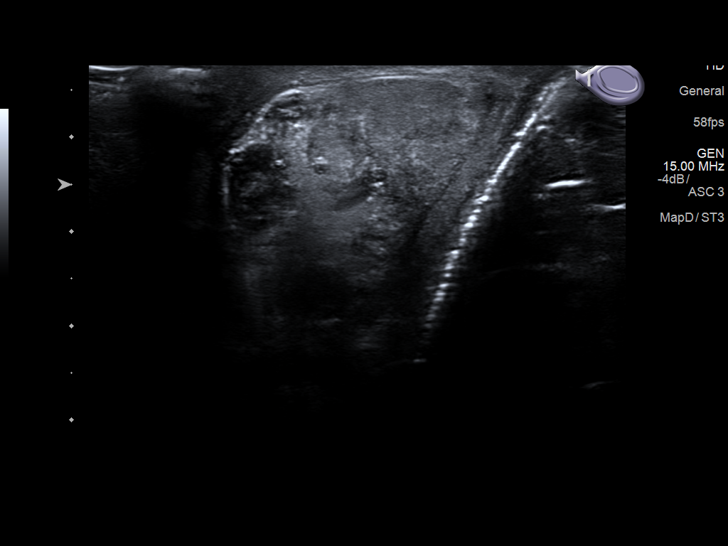
[im 118/118]
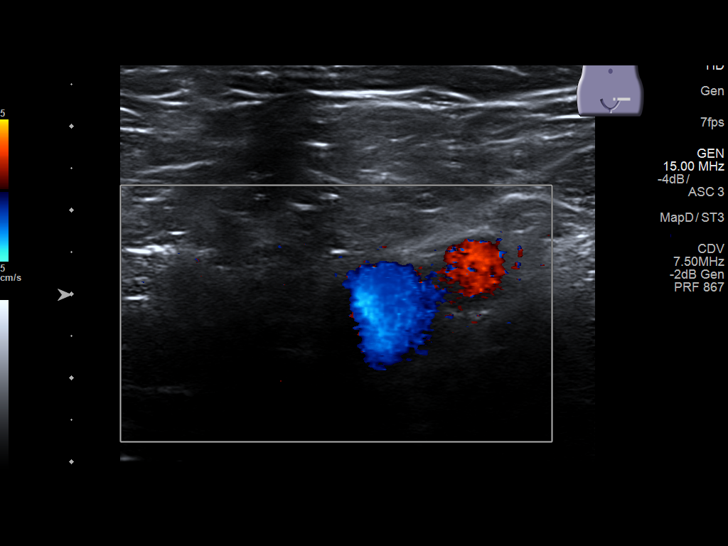

[13 of 25 positions shown; findings below may reference images not displayed]

FINDINGS: Right testicle

Measurements: 4.7 x 2.3 x 2.9 cm. No mass or microlithiasis
visualized.

Left testicle

Measurements: 4.1 x 2.3 x 2.9 cm. No mass or microlithiasis
visualized.

Right epididymis:  4 mm epididymal head cyst.

Left epididymis:  Increased heterogeneity and vascularity.

Hydrocele:  Very small bilateral hydroceles.

Varicocele:  Small left varicocele measuring up to 3 mm

Pulsed Doppler interrogation of both testes demonstrates normal low
resistance arterial and venous waveforms bilaterally.
IMPRESSION: No testicular abnormality noted. Venous and arterial flow noted to
both testicles.

Heterogeneous left epididymis with increased vascularity may
represent changes of epididymitis.

Small left varicocele measuring up to 3 mm.

Right epididymal head 4 mm cyst.

Very small bilateral hydroceles.

## 2018-04-06 ENCOUNTER — Ambulatory Visit
Admission: EM | Admit: 2018-04-06 | Discharge: 2018-04-06 | Disposition: A | Discharge status: Home / Self Care | Payer: BLUE CROSS/BLUE SHIELD | Attending: Emergency Medicine | Admitting: Emergency Medicine

## 2018-04-06 DIAGNOSIS — Z23 Encounter for immunization: Secondary | ICD-10-CM

## 2018-04-06 DIAGNOSIS — S80252A Superficial foreign body, left knee, initial encounter: Secondary | ICD-10-CM | POA: Diagnosis not present

## 2018-04-06 DIAGNOSIS — L089 Local infection of the skin and subcutaneous tissue, unspecified: Secondary | ICD-10-CM | POA: Diagnosis not present

## 2018-04-06 MED ORDER — IBUPROFEN 600 MG PO TABS: 600.0000 mg | ORAL_TABLET | Freq: Four times a day (QID) | ORAL | 0 refills | Status: AC | PRN

## 2018-04-06 MED ORDER — DOXYCYCLINE HYCLATE 100 MG PO CAPS: 100.0000 mg | ORAL_CAPSULE | Freq: Two times a day (BID) | ORAL | 0 refills | Status: AC

## 2018-04-06 MED ORDER — TETANUS-DIPHTH-ACELL PERTUSSIS 5-2.5-18.5 LF-MCG/0.5 IM SUSP
0.5000 mL | Freq: Once | INTRAMUSCULAR | Status: AC
  Administered 2018-04-06: 0.5 mL via INTRAMUSCULAR

## 2018-04-06 NOTE — Discharge Instructions (Addendum)
Finish the doxycycline, even if you feel better.  Apply warm compresses to the area.  600 mg of ibuprofen combined with 1 g of Tylenol together 3 or 4 times a day as needed for pain.  Follow-up with [facility] or Dr. Rexanne Mano when the infection has resolved.  Go immediately to the ER for fevers above 100.4, body aches, if your knee swells significantly, if you cannot move your knee at all, or for any other concerns.

## 2018-04-06 NOTE — ED Triage Notes (Signed)
Pt states he got some glass stuck in his left knee that has been present for 1 month. Thought he got it all out but now having left knee pain. It's swollen, red and now he can feel it under the skin. Was cleaning up glass a month ago and kneeled down into the glass. States the pain is spreading and did take some tylenol today.

## 2018-04-06 NOTE — ED Provider Notes (Signed)
HPI  SUBJECTIVE:  Isaiah Tucker is a 23 y.o. male who presents with a possible foreign body in his left knee along the superior aspect of the patella.  Patient states that he was kneeling on some glass a month ago and several glass shards went through his pants.  He was able to remove a piece that night, but states that there is "still some in there".  He reports a palpable mass, hypersensitivity, throbbing, constant localized pain for the past several days.  He reports increasing erythema and increasing skin temperature over the past several days.  The mass has not changed in size.  He has been trying to remove it with tweezers daily for the past 3 days.  No alleviating factors.  Symptoms are worse when he bends/flexes his knee or with weightbearing.  He denies fevers, body aches, purulent drainage.  He took Tylenol  within 4 to 6 hours of evaluation.  Past medical history negative for diabetes, hypertension.  Tetanus unknown.  PMD: None.    Past Medical History:  Diagnosis Date  . Epididymitis   . History of kidney stones     History reviewed. No pertinent surgical history.  Family History  Problem Relation Age of Onset  . Kidney Stones Father   . Healthy Father   . Healthy Mother     Social History   Tobacco Use  . Smoking status: Current Some Day Smoker  . Smokeless tobacco: Never Used  . Tobacco comment: cigars and E Cigs-stress relief 3x yearly  Substance Use Topics  . Alcohol use: Yes    Alcohol/week: 0.0 standard drinks    Comment: moderate, every weekend.  . Drug use: No    No current facility-administered medications for this encounter.   Current Outpatient Medications:  .  doxycycline (VIBRAMYCIN) 100 MG capsule, Take 1 capsule (100 mg total) by mouth 2 (two) times daily for 7 days., Disp: 14 capsule, Rfl: 0 .  ibuprofen (ADVIL,MOTRIN) 600 MG tablet, Take 1 tablet (600 mg total) by mouth every 6 (six) hours as needed., Disp: 30 tablet, Rfl: 0  No Known  Allergies   ROS  As noted in HPI.   Physical Exam  BP 109/78 (BP Location: Left Arm)   Pulse 69   Temp 98.3 F (36.8 C) (Oral)   Resp 18   Ht 5\' 7"  (1.702 m)   Wt 68.9 kg   SpO2 99%   BMI 23.81 kg/m   Constitutional: Well developed, well nourished, no acute distress Eyes:  EOMI, conjunctiva normal bilaterally HENT: Normocephalic, atraumatic,mucus membranes moist Respiratory: Normal inspiratory effort Cardiovascular: Normal rate GI: nondistended skin: No rash, skin intact Musculoskeletal: Tender erythematous papule at the superior aspect of the left patella.  Positive palpable mass.  No expressible purulent drainage.  Trace effusion.  Patient able to move knee through full range of motion.  No increased temperature.  No crepitus.  No other tenderness along the knee.  Sensation grossly intact distally. Neurologic: Alert & oriented x 3, no focal neuro deficits Psychiatric: Speech and behavior appropriate   ED Course   Medications  Tdap (BOOSTRIX) injection 0.5 mL (0.5 mLs Intramuscular Given 04/06/18 1923)    No orders of the defined types were placed in this encounter.   No results found for this or any previous visit (from the past 24 hour(s)). No results found.  ED Clinical Impression  Foreign body of knee with infection, left, initial encounter   ED Assessment/Plan  Suspect retained foreign body.  It appears to be infected, most likely from him trying to remove it with a pair of tweezers for the past few days.  Updating tetanus.  Will send him home on doxycycline for 7 days to control the infection, Tylenol/ibuprofen combination for pain and swelling, and then refer to orthopedics to have it further evaluated and possibly removed.  No evidence of  a septic joint today.  Discussed MDM, treatment plan, and plan for follow-up with patient. Discussed sn/sx that should prompt return to the ED. patient agrees with plan.   Meds ordered this encounter  Medications  .  Tdap (BOOSTRIX) injection 0.5 mL  . doxycycline (VIBRAMYCIN) 100 MG capsule    Sig: Take 1 capsule (100 mg total) by mouth 2 (two) times daily for 7 days.    Dispense:  14 capsule    Refill:  0  . ibuprofen (ADVIL,MOTRIN) 600 MG tablet    Sig: Take 1 tablet (600 mg total) by mouth every 6 (six) hours as needed.    Dispense:  30 tablet    Refill:  0    *This clinic note was created using Scientist, clinical (histocompatibility and immunogenetics)Dragon dictation software. Therefore, there may be occasional mistakes despite careful proofreading.   ?   Domenick GongMortenson, Dylan Ruotolo, MD 04/07/18 [phone]

## 2018-07-18 ENCOUNTER — Emergency Department
Admission: EM | Admit: 2018-07-18 | Discharge: 2018-07-19 | Disposition: A | Discharge status: Home / Self Care | Payer: BLUE CROSS/BLUE SHIELD | Attending: Emergency Medicine | Admitting: Emergency Medicine

## 2018-07-18 ENCOUNTER — Emergency Department: Payer: BLUE CROSS/BLUE SHIELD

## 2018-07-18 ENCOUNTER — Encounter: Payer: Self-pay | Admitting: Emergency Medicine

## 2018-07-18 ENCOUNTER — Other Ambulatory Visit: Payer: Self-pay

## 2018-07-18 DIAGNOSIS — R52 Pain, unspecified: Secondary | ICD-10-CM

## 2018-07-18 DIAGNOSIS — N5082 Scrotal pain: Secondary | ICD-10-CM | POA: Diagnosis present

## 2018-07-18 DIAGNOSIS — F17210 Nicotine dependence, cigarettes, uncomplicated: Secondary | ICD-10-CM | POA: Diagnosis not present

## 2018-07-18 DIAGNOSIS — Z87442 Personal history of urinary calculi: Secondary | ICD-10-CM | POA: Insufficient documentation

## 2018-07-18 LAB — URINALYSIS, COMPLETE (UACMP) WITH MICROSCOPIC
Bacteria, UA: NONE SEEN
Bilirubin Urine: NEGATIVE
Glucose, UA: NEGATIVE mg/dL
Hgb urine dipstick: NEGATIVE
Ketones, ur: NEGATIVE mg/dL
Leukocytes,Ua: NEGATIVE
Nitrite: NEGATIVE
Protein, ur: NEGATIVE mg/dL
Specific Gravity, Urine: 1.021 (ref 1.005–1.030)
Squamous Epithelial / LPF: NONE SEEN (ref 0–5)
pH: 6 (ref 5.0–8.0)

## 2018-07-18 MED ORDER — DOXYCYCLINE HYCLATE 100 MG PO CAPS: 100.0000 mg | ORAL_CAPSULE | Freq: Two times a day (BID) | ORAL | 0 refills | Status: DC

## 2018-07-18 MED ORDER — HYDROCODONE-ACETAMINOPHEN 5-325 MG PO TABS
1.0000 | ORAL_TABLET | Freq: Once | ORAL | Status: AC
  Administered 2018-07-18: 1 via ORAL
  Filled 2018-07-18: qty 1

## 2018-07-18 MED ORDER — HYDROCODONE-ACETAMINOPHEN 5-325 MG PO TABS: 1.0000 | ORAL_TABLET | Freq: Four times a day (QID) | ORAL | 0 refills | Status: AC | PRN

## 2018-07-18 MED ORDER — DOXYCYCLINE HYCLATE 100 MG PO TABS
100.0000 mg | ORAL_TABLET | Freq: Once | ORAL | Status: AC
  Administered 2018-07-18: 100 mg via ORAL
  Filled 2018-07-18: qty 1

## 2018-07-18 MED ORDER — CEFTRIAXONE SODIUM 250 MG IJ SOLR
250.0000 mg | Freq: Once | INTRAMUSCULAR | Status: AC
  Administered 2018-07-18: 23:00:00 250 mg via INTRAMUSCULAR
  Filled 2018-07-18: qty 250

## 2018-07-18 NOTE — ED Provider Notes (Addendum)
[facility] Emergency Department Provider Note   ____________________________________________   None    (approximate)  I have reviewed the triage vital signs and the nursing notes.   HISTORY  Chief Complaint No chief complaint on file.    HPI Isaiah Tucker is a 23 y.o. male who complains of 2 days of pain and swelling in the right testicle.  He reports he got slightly better with masturbation but came right back.  He has had a kidney stone before and this feels nothing like it all the pains only in the right testicle and there is swelling as well.  He does not have any discharge.  He has not had sex for about a month.         Past Medical History:  Diagnosis Date  . Epididymitis   . History of kidney stones     Patient Active Problem List   Diagnosis Date Noted  . Microscopic hematuria 10/03/2014  . Kidney stones 08/19/2014  . Right flank pain 08/19/2014    History reviewed. No pertinent surgical history.  Prior to Admission medications   Medication Sig Start Date End Date Taking? Authorizing Provider  doxycycline (VIBRAMYCIN) 100 MG capsule Take 1 capsule (100 mg total) by mouth 2 (two) times daily. 07/18/18   Arnaldo NatalMalinda, Ilah Boule F, MD  HYDROcodone-acetaminophen (NORCO/VICODIN) 5-325 MG tablet Take 1 tablet by mouth every 6 (six) hours as needed for moderate pain. 07/18/18   Arnaldo NatalMalinda, Natan Hartog F, MD  ibuprofen (ADVIL,MOTRIN) 600 MG tablet Take 1 tablet (600 mg total) by mouth every 6 (six) hours as needed. 04/06/18   Domenick GongMortenson, Ashley, MD    Allergies Patient has no known allergies.  Family History  Problem Relation Age of Onset  . Kidney Stones Father   . Healthy Father   . Healthy Mother     Social History Social History   Tobacco Use  . Smoking status: Current Some Day Smoker  . Smokeless tobacco: Current User  . Tobacco comment: cigars and E Cigs-stress relief 3x yearly  Substance Use Topics  . Alcohol use: Yes    Alcohol/week: 0.0  standard drinks    Comment: moderate, every weekend.  . Drug use: No    Review of Systems  Constitutional: No fever/chills Eyes: No visual changes. ENT: No sore throat. Cardiovascular: Denies chest pain. Respiratory: Denies shortness of breath. Gastrointestinal: No abdominal pain.  No nausea, no vomiting.  No diarrhea.  No constipation. Genitourinary: Negative for dysuria. Musculoskeletal: Negative for back pain. Skin: Negative for rash. Neurological: Negative for headaches, focal weakness   ____________________________________________   PHYSICAL EXAM:  VITAL SIGNS: ED Triage Vitals [07/18/18 2048]  Enc Vitals Group     BP 115/70     Pulse Rate 81     Resp 18     Temp 98 F (36.7 C)     Temp Source Oral     SpO2 98 %     Weight 150 lb (68 kg)     Height 5\' 7"  (1.702 m)     Head Circumference      Peak Flow      Pain Score 8     Pain Loc      Pain Edu?      Excl. in GC?     Constitutional: Alert and oriented. Well appearing and in no acute distress. Eyes: Conjunctivae are normal. Head: Atraumatic. Nose: No congestion/rhinnorhea.. Neck: No stridor.   Cardiovascular: Normal rate, regular rhythm. Grossly normal heart sounds.  Good peripheral circulation. Respiratory: Normal respiratory effort.  No retractions. Lungs CTAB. Gastrointestinal: Soft and nontender. No distention. No abdominal bruits.  Genitourinary: Normal circumcised male right testicle is tender and somewhat swollen he does have a bilateral cremasteric reflex.  I do not see any blue spots on the testicles. Musculoskeletal: No lower extremity tenderness nor edema.  Neurologic:  Normal speech and language. No gross focal neurologic deficits are appreciated Skin:  Skin is warm, dry and intact. No rash noted. Psychiatric: Mood and affect are normal. Speech and behavior are normal.  ____________________________________________   LABS (all labs ordered are listed, but only abnormal results are displayed)   Labs Reviewed  URINALYSIS, COMPLETE (UACMP) WITH MICROSCOPIC - Abnormal; Notable for the following components:      Result Value   Color, Urine YELLOW (*)    APPearance HAZY (*)    All other components within normal limits  CHLAMYDIA/NGC RT PCR ([facility] ONLY)   ____________________________________________  EKG   ____________________________________________  RADIOLOGY  ED MD interpretation: Ultrasound of the scrotum read by radiology shows some small right epididymal head cyst otherwise negative.  Official radiology report(s): US Scrotum Doppler  Result Date: 07/18/2018 CLINICAL DATA:  23 year old male with right testicular pain and swelling. EXAM: SCROTAL ULTRASOUND DOPPLER ULTRASOUND OF THE TESTICLES TECHNIQUE: Complete ultrasound examination of the testicles, epididymis, and other scrotal structures was performed. Color and spectral Doppler ultrasound were also utilized to evaluate blood flow to the testicles. COMPARISON:  None. FINDINGS: Right testicle Measurements: 4.6 x 1.9 x 2.9 cm. No mass or microlithiasis visualized. Left testicle Measurements: 4.4 x 2.1 x 2.7 cm. No mass or microlithiasis visualized. Right epididymis: Normal in size and appearance. Two small epididymal head cysts noted measuring up to 5 mm. Left epididymis:  Normal in size and appearance. Hydrocele:  None visualized. Varicocele:  None visualized. Pulsed Doppler interrogation of both testes demonstrates normal low resistance arterial and venous waveforms bilaterally. IMPRESSION: 1. Unremarkable testicles with Doppler detected arterial and venous flow bilaterally. 2. Small right epididymal head cysts. Electronically Signed   By: Elgie Collard M.D.   On: 07/18/2018 22:08    ____________________________________________   PROCEDURES  Procedure(s) performed (including Critical Care):  Procedures   ____________________________________________   INITIAL IMPRESSION / ASSESSMENT AND PLAN / ED COURSE   Patient is very tender as though he is having epididymitis or possibly even orchitis.  He is too tender for me to accurately tell which is the most tender.  Anticipate treating with antibiotics. Patient says he has had renal colic before and this does not feel at all like it.  Especially since his scrotum is swollen.             ____________________________________________   FINAL CLINICAL IMPRESSION(S) / ED DIAGNOSES  Final diagnoses:  Pain  Scrotal pain     ED Discharge Orders         Ordered    doxycycline (VIBRAMYCIN) 100 MG capsule  2 times daily     07/18/18 2250    HYDROcodone-acetaminophen (NORCO/VICODIN) 5-325 MG tablet  Every 6 hours PRN     07/18/18 2356           Note:  This document was prepared using Dragon voice recognition software and may include unintentional dictation errors.    Arnaldo Natal, MD 07/19/18 8366    Arnaldo Natal, MD 07/19/18 [phone]

## 2018-07-18 NOTE — ED Triage Notes (Signed)
Patient ambulatory to triage with steady gait, without difficulty or distress noted, mask in place; pt reports x 2 days having pain & swelling to right testicle; denies any injury; no urinary c/o, no discharge

## 2018-07-18 NOTE — Discharge Instructions (Signed)
Take the doxycycline 1 pill twice a day.  We gave you today's dose here in the emergency room.  Return for worse pain, fever vomiting or feeling sicker.  Use Tylenol or Advil for the pain.  You can take up to 4 the over-the-counter Advil 3 times a day.  If need be you can take Vicodin 1 pill 4 times a day for worse pain.  Do not mix Tylenol and Vicodin together.  The Vicodin also has Tylenol and it and you could get too much Tylenol.

## 2018-07-19 LAB — CHLAMYDIA/NGC RT PCR (ARMC ONLY)
Chlamydia Tr: NOT DETECTED
N gonorrhoeae: NOT DETECTED

## 2018-07-19 MED ORDER — KETOROLAC TROMETHAMINE 30 MG/ML IJ SOLN
30.0000 mg | Freq: Once | INTRAMUSCULAR | Status: AC
  Administered 2018-07-19: 30 mg via INTRAVENOUS
  Filled 2018-07-19: qty 1

## 2018-08-23 MED ORDER — HEPARIN SODIUM (PORCINE) 1000 UNIT/ML IJ SOLN
INTRAMUSCULAR | Status: AC
  Filled 2018-08-23: qty 1

## 2018-09-21 ENCOUNTER — Other Ambulatory Visit: Payer: Self-pay

## 2018-09-21 DIAGNOSIS — Z20822 Contact with and (suspected) exposure to covid-19: Secondary | ICD-10-CM

## 2018-09-26 LAB — NOVEL CORONAVIRUS, NAA: SARS-CoV-2, NAA: NOT DETECTED

## 2019-07-10 ENCOUNTER — Ambulatory Visit
Admission: EM | Admit: 2019-07-10 | Discharge: 2019-07-10 | Disposition: A | Discharge status: Home / Self Care | Payer: PRIVATE HEALTH INSURANCE | Attending: Emergency Medicine | Admitting: Emergency Medicine

## 2019-07-10 DIAGNOSIS — J0100 Acute maxillary sinusitis, unspecified: Secondary | ICD-10-CM | POA: Diagnosis not present

## 2019-07-10 DIAGNOSIS — Z03818 Encounter for observation for suspected exposure to other biological agents ruled out: Secondary | ICD-10-CM

## 2019-07-10 DIAGNOSIS — R05 Cough: Secondary | ICD-10-CM

## 2019-07-10 DIAGNOSIS — R059 Cough, unspecified: Secondary | ICD-10-CM

## 2019-07-10 MED ORDER — BENZONATATE 100 MG PO CAPS: 100.0000 mg | ORAL_CAPSULE | Freq: Three times a day (TID) | ORAL | 0 refills | Status: AC | PRN

## 2019-07-10 MED ORDER — AMOXICILLIN 875 MG PO TABS: 875.0000 mg | ORAL_TABLET | Freq: Two times a day (BID) | ORAL | 0 refills | Status: AC

## 2019-07-10 NOTE — ED Provider Notes (Signed)
Isaiah Tucker    CSN: [id number] Arrival date & time: 07/10/19  1644      History   Chief Complaint Chief Complaint  Patient presents with  . Cough  . Generalized Body Aches    HPI Isaiah Tucker is a 24 y.o. male.   Patient presents with nonproductive cough, nasal congestion, postnasal drip, rhinorrhea, x >1 week.  He also reports fatigue, diarrhea, chills intermittently.  He denies fever, sore throat, shortness of breath, vomiting, or other symptoms.  He states his symptoms started after going to an abandoned building with some friends.  He states the same friends are symptomatic also.  No recent COVID testing.  Patient has not received the COVID vaccines.  The history is provided by the patient.    Past Medical History:  Diagnosis Date  . Epididymitis   . History of kidney stones     Patient Active Problem List   Diagnosis Date Noted  . Microscopic hematuria 10/03/2014  . Kidney stones 08/19/2014  . Right flank pain 08/19/2014    History reviewed. No pertinent surgical history.     Home Medications    Prior to Admission medications   Medication Sig Start Date End Date Taking? Authorizing Provider  amoxicillin (AMOXIL) 875 MG tablet Take 1 tablet (875 mg total) by mouth 2 (two) times daily for 7 days. 07/10/19 07/17/19  Mickie Bail, NP  benzonatate (TESSALON) 100 MG capsule Take 1 capsule (100 mg total) by mouth 3 (three) times daily as needed for cough. 07/10/19   Mickie Bail, NP  HYDROcodone-acetaminophen (NORCO/VICODIN) 5-325 MG tablet Take 1 tablet by mouth every 6 (six) hours as needed for moderate pain. 07/18/18   Arnaldo Natal, MD  ibuprofen (ADVIL,MOTRIN) 600 MG tablet Take 1 tablet (600 mg total) by mouth every 6 (six) hours as needed. 04/06/18   Domenick Gong, MD    Family History Family History  Problem Relation Age of Onset  . Kidney Stones Father   . Healthy Father   . Healthy Mother     Social History Social History   Tobacco Use   . Smoking status: Current Some Day Smoker  . Smokeless tobacco: Current User  . Tobacco comment: cigars and E Cigs-stress relief 3x yearly  Substance Use Topics  . Alcohol use: Yes    Alcohol/week: 0.0 standard drinks    Comment: moderate, every weekend.  . Drug use: No     Allergies   Patient has no known allergies.   Review of Systems Review of Systems  Constitutional: Positive for chills and fatigue. Negative for fever.  HENT: Positive for congestion, postnasal drip and rhinorrhea. Negative for ear pain and sore throat.   Eyes: Negative for pain and visual disturbance.  Respiratory: Positive for cough. Negative for shortness of breath.   Cardiovascular: Negative for chest pain and palpitations.  Gastrointestinal: Positive for diarrhea. Negative for abdominal pain, nausea and vomiting.  Genitourinary: Negative for dysuria and hematuria.  Musculoskeletal: Negative for arthralgias and back pain.  Skin: Negative for color change and rash.  Neurological: Negative for seizures and syncope.  All other systems reviewed and are negative.    Physical Exam Triage Vital Signs ED Triage Vitals  Enc Vitals Group     BP      Pulse      Resp      Temp      Temp src      SpO2      Weight  Height      Head Circumference      Peak Flow      Pain Score      Pain Loc      Pain Edu?      Excl. in GC?    No data found.  Updated Vital Signs BP 128/82 (BP Location: Left Arm)   Pulse 84   Temp 99.3 F (37.4 C) (Oral)   Ht 5\' 6"  (1.676 m)   Wt 160 lb (72.6 kg)   SpO2 96%   BMI 25.82 kg/m   Visual Acuity Right Eye Distance:   Left Eye Distance:   Bilateral Distance:    Right Eye Near:   Left Eye Near:    Bilateral Near:     Physical Exam Vitals and nursing note reviewed.  Constitutional:      General: He is not in acute distress.    Appearance: He is well-developed. He is not ill-appearing.  HENT:     Head: Normocephalic and atraumatic.     Right Ear:  Tympanic membrane normal.     Left Ear: Tympanic membrane normal.     Nose: Congestion and rhinorrhea present.     Mouth/Throat:     Mouth: Mucous membranes are moist.     Pharynx: Oropharynx is clear.  Eyes:     Conjunctiva/sclera: Conjunctivae normal.  Cardiovascular:     Rate and Rhythm: Normal rate and regular rhythm.     Heart sounds: No murmur.  Pulmonary:     Effort: Pulmonary effort is normal. No respiratory distress.     Breath sounds: Normal breath sounds. No wheezing or rhonchi.  Abdominal:     Palpations: Abdomen is soft.     Tenderness: There is no abdominal tenderness. There is no guarding or rebound.  Musculoskeletal:     Cervical back: Neck supple.  Skin:    General: Skin is warm and dry.     Findings: No rash.  Neurological:     General: No focal deficit present.     Mental Status: He is alert and oriented to person, place, and time.     Gait: Gait normal.  Psychiatric:        Mood and Affect: Mood normal.        Behavior: Behavior normal.      UC Treatments / Results  Labs (all labs ordered are listed, but only abnormal results are displayed) Labs Reviewed  NOVEL CORONAVIRUS, NAA    EKG   Radiology No results found.  Procedures Procedures (including critical care time)  Medications Ordered in UC Medications - No data to display  Initial Impression / Assessment and Plan / UC Course  I have reviewed the triage vital signs and the nursing notes.  Pertinent labs & imaging results that were available during my care of the patient were reviewed by me and considered in my medical decision making (see chart for details).   Acute sinusitis, Cough.  Treating with amoxicillin and Tessalon Perles.  COVID test performed here.  Instructed patient to self quarantine until the test result is back.  Discussed with patient that he can take Tylenol as needed for fever or discomfort.  Instructed patient to go to the emergency department if he develops high  fever, shortness of breath, severe diarrhea, or other concerning symptoms.  Patient agrees with plan of care.     Final Clinical Impressions(s) / UC Diagnoses   Final diagnoses:  Acute non-recurrent maxillary sinusitis  Cough  Discharge Instructions     Take the amoxicillin and Tesslon Perles as directed.    Your COVID test is pending.  You should self quarantine until the test result is back.    Take Tylenol as needed for fever or discomfort.  Rest and keep yourself hydrated.    Go to the emergency department if you develop shortness of breath, severe diarrhea, high fever not relieved by Tylenol or ibuprofen, or other concerning symptoms.       ED Prescriptions    Medication Sig Dispense Auth. Provider   amoxicillin (AMOXIL) 875 MG tablet Take 1 tablet (875 mg total) by mouth 2 (two) times daily for 7 days. 14 tablet Sharion Balloon, NP   benzonatate (TESSALON) 100 MG capsule Take 1 capsule (100 mg total) by mouth 3 (three) times daily as needed for cough. 21 capsule Sharion Balloon, NP     PDMP not reviewed this encounter.   Sharion Balloon, NP 07/10/19 1742

## 2019-07-10 NOTE — ED Triage Notes (Signed)
Pt presents with c/o nasal congestion, cough and some wheeze increasing over the past week. Pt also reports some sweats, chills, backache, diarrhea and generally not feeling well. Pt states about a week and a half ago he visited a dilapidated building for several hours with some friends, without a mask. Pt reports none of his friends were sick at that time, but have since also had similar symptoms. Pt denies fever, n/v or other symptoms.

## 2019-07-10 NOTE — Discharge Instructions (Signed)
Take the amoxicillin and Tesslon Perles as directed.    Your COVID test is pending.  You should self quarantine until the test result is back.    Take Tylenol as needed for fever or discomfort.  Rest and keep yourself hydrated.    Go to the emergency department if you develop shortness of breath, severe diarrhea, high fever not relieved by Tylenol or ibuprofen, or other concerning symptoms.

## 2019-07-11 LAB — NOVEL CORONAVIRUS, NAA: SARS-CoV-2, NAA: NOT DETECTED

## 2019-07-11 LAB — SARS-COV-2, NAA 2 DAY TAT

## 2020-03-04 ENCOUNTER — Emergency Department
Admission: EM | Admit: 2020-03-04 | Discharge: 2020-03-04 | Disposition: A | Discharge status: Other Acute Inpatient Hospital | Payer: 59

## 2022-11-04 ENCOUNTER — Ambulatory Visit: Payer: Self-pay | Admitting: General Surgery

## 2022-11-04 NOTE — H&P (Signed)
PATIENT PROFILE: Isaiah Tucker is a 27 y.o. 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  PCP:  Provider  HISTORY OF PRESENT ILLNESS: Isaiah Tucker reports she had an episode of infected pilonidal cyst about 79-month ago.  He endorses that he was having significant pain in the sacral area.  He endorses that he drained spontaneously and he was treated with oral antibiotic therapy.  Pain localized to the cervical area.  No pain radiation.  Aggravating factor was applying pressure.  Alleviating factor was spontaneous drainage of the infection.  He understood that in the last few days he will start to have pain again on the sacral area.  He took Tylenol which improved the pain.  He denies any fever.  He denies any drainage.  He does endorses significant improvements this morning.  Decrease swelling.   PROBLEM LIST: Problem List  Date Reviewed: 07/09/2022  None   GENERAL REVIEW OF SYSTEMS:   General ROS: negative for - chills, fatigue, fever, weight gain or weight loss Allergy and Immunology ROS: negative for - hives  Hematological and Lymphatic ROS: negative for - bleeding problems or bruising, negative for palpable nodes Endocrine ROS: negative for - heat or cold intolerance, hair changes Respiratory ROS: negative for - cough, shortness of breath or wheezing Cardiovascular ROS: no chest pain or palpitations GI ROS: negative for nausea, vomiting, abdominal pain, diarrhea, constipation Musculoskeletal ROS: negative for - joint swelling or muscle pain Neurological ROS: negative for - confusion, syncope Dermatological ROS: negative for pruritus and rash Psychiatric: negative for anxiety, depression, difficulty sleeping and memory loss  MEDICATIONS: Current Outpatient Medications  Medication Sig Dispense Refill   acetaminophen (TYLENOL) 500 MG tablet Take by mouth     cetirizine (ZYRTEC) 10 MG chewable tablet Take 10 mg by mouth once daily     codeine-guaifenesin 10-100  mg/5 mL oral liquid Take 5 mLs by mouth every 6 (six) hours as needed for Cough (Patient not taking: Reported on 07/09/2022) 120 mL 0   HYDROcodone-acetaminophen (NORCO) 5-325 mg tablet Take 1 tablet by mouth every 6 (six) hours as needed for Pain for up to 20 doses (Patient not taking: Reported on 11/04/2022) 20 tablet 0   No current facility-administered medications for this visit.    ALLERGIES: Patient has no known allergies.  PAST MEDICAL HISTORY: Past medical history reviewed.  No pertinent past medical history  PAST SURGICAL HISTORY: History reviewed. No pertinent surgical history.   FAMILY HISTORY: Family history reviewed.  No pertinent family history  SOCIAL HISTORY: Social History   Socioeconomic History   Marital status: Married  Tobacco Use   Smoking status: Never   Smokeless tobacco: Never  Vaping Use   Vaping status: Former  Substance and Sexual Activity   Alcohol use: Yes   Drug use: Never   Sexual activity: Defer    PHYSICAL EXAM: Vitals:   11/04/22 1541  BP: 115/81  Pulse: 55   Body mass index is 27.86 kg/m. Weight: 80.7 kg (177 lb 14.6 oz)   GENERAL: Alert, active, oriented x3  HEENT: Pupils equal reactive to light. Extraocular movements are intact. Sclera clear. Palpebral conjunctiva normal red color.Pharynx clear.  NECK: Supple with no palpable mass and no adenopathy.  LUNGS: Sound clear with no rales rhonchi or wheezes.  HEART: Regular rhythm S1 and S2 without murmur.  ABDOMEN: Soft and depressible, nontender with no palpable mass, no hepatomegaly.   BACK: Multiple gluteal cleft pits with inflammation mostly on the right  side.  No acute abscess.  EXTREMITIES: Well-developed well-nourished symmetrical with no dependent edema.  NEUROLOGICAL: Awake alert oriented, facial expression symmetrical, moving all extremities.  REVIEW OF DATA: I have reviewed the following data today: No visits with results within 3 Month(s) from this visit.   Latest known visit with results is:  Initial consult on 12/16/2020  Component Date Value   Influenza A PCR 12/16/2020 Negative    Influenza B PCR 12/16/2020 Negative    SARS-CoV2 PCR 12/16/2020 Negative      ASSESSMENT: Isaiah Tucker is a 27 y.o. male presenting for consultation for pilonidal disease.  Patient with history of infected pilonidal cyst 60-month ago.  He had a recent episode of pain.  He was able to take Tylenol with improvement.  Due to the recurrent episodes we discussed about surgical intervention.  We discussed about minimally invasive versus extensive excision with cleft lift procedure.  At this point he would like to proceed with the less invasive procedure.  Still will do extensive excision of the panel of the cyst localized to the current beds.  Discussed with patient the risk of bleeding, infection, pain, scar tissue, recurrence, among others.  The patient reported he understood and agreed to proceed.  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   Patient and his wife verbalized understanding, all questions were answered, and were agreeable with the plan outlined above.     Carolan Shiver, MD  Electronically signed by Carolan Shiver, MD

## 2022-11-05 ENCOUNTER — Encounter: Payer: Self-pay | Admitting: Registered Nurse

## 2022-11-05 ENCOUNTER — Encounter: Payer: Self-pay | Admitting: Urgent Care

## 2022-11-10 ENCOUNTER — Inpatient Hospital Stay
Admission: RE | Admit: 2022-11-10 | Discharge: 2022-11-10 | Disposition: A | Discharge status: Home / Self Care | Payer: 59 | Source: Ambulatory Visit

## 2022-11-10 HISTORY — DX: Other microscopic hematuria: R31.29

## 2022-11-10 NOTE — Pre-Procedure Instructions (Signed)
Contacted patient, he stated he is unable to have the surgery at this time. Contact made with Hardin Negus notifying her of the patient decision.

## 2022-11-10 NOTE — Patient Instructions (Addendum)
Your procedure is scheduled on: Monday September 9  Report to the Registration Desk on the 1st floor of the [facility] To find out your arrival time, please call [phone] between 1PM - 3PM on:  Friday September 6 If your arrival time is 6:00 am, do not arrive before that time as the [facility] entrance doors do not open until 6:00 am.  REMEMBER: Instructions that are not followed completely may result in serious medical risk, up to and including death; or upon the discretion of your surgeon and anesthesiologist your surgery may need to be rescheduled.  Do not eat food after midnight the night before surgery.  No gum chewing or hard candies.   One week prior to surgery: Stop Anti-inflammatories (NSAIDS) such as Advil, Aleve, Ibuprofen, Motrin, Naproxen, Naprosyn and Aspirin based products such as Excedrin, Goody's Powder, BC Powder. Stop ANY OVER THE COUNTER supplements until after surgery. You may however, continue to take Tylenol if needed for pain up until the day of surgery.  Continue taking all prescribed medications with the exception of the following:   TAKE ONLY THESE MEDICATIONS THE MORNING OF SURGERY WITH A SIP OF WATER:     No Alcohol for 24 hours before or after surgery.  No Smoking including e-cigarettes for 24 hours before surgery.  No chewable tobacco products for at least 6 hours before surgery.  No nicotine patches on the day of surgery.  Do not use any "recreational" drugs for at least a week (preferably 2 weeks) before your surgery.  Please be advised that the combination of cocaine and anesthesia may have negative outcomes, up to and including death. If you test positive for cocaine, your surgery will be cancelled.  On the morning of surgery brush your teeth with toothpaste and water, you may rinse your mouth with mouthwash if you wish. Do not swallow any toothpaste or mouthwash.  Use CHG Soap as directed on instruction sheet.  Do not wear  jewelry, make-up, hairpins, clips or nail polish.  Do not wear lotions, powders, or perfumes.   Do not shave body hair from the neck down 48 hours before surgery.  Contact lenses, hearing aids and dentures may not be worn into surgery.  Do not bring valuables to the hospital. [facility] is not responsible for any missing/lost belongings or valuables.   Notify your doctor if there is any change in your medical condition (cold, fever, infection).  Wear comfortable clothing (specific to your surgery type) to the hospital.  After surgery, you can help prevent lung complications by doing breathing exercises.  Take deep breaths and cough every 1-2 hours.   If you are being discharged the day of surgery, you will not be allowed to drive home. You will need a responsible individual to drive you home and stay with you for 24 hours after surgery.   If you are taking public transportation, you will need to have a responsible individual with you.  Please call the Pre-admissions Testing Dept. at [phone] if you have any questions about these instructions.  Surgery Visitation Policy:  Patients having surgery or a procedure may have two visitors.  Children under the age of 27 must have an adult with them who is not the patient.            Preparing for Surgery with CHLORHEXIDINE GLUCONATE (CHG) Soap  Chlorhexidine Gluconate (CHG) Soap  o An antiseptic cleaner that kills germs and bonds with the skin to continue killing germs even  after washing  o Used for showering the night before surgery and morning of surgery  Before surgery, you can play an important role by reducing the number of germs on your skin.  CHG (Chlorhexidine gluconate) soap is an antiseptic cleanser which kills germs and bonds with the skin to continue killing germs even after washing.  Please do not use if you have an allergy to CHG or antibacterial soaps. If your skin becomes reddened/irritated stop using  the CHG.  1. Shower the NIGHT BEFORE SURGERY and the MORNING OF SURGERY with CHG soap.  2. If you choose to wash your hair, wash your hair first as usual with your normal shampoo.  3. After shampooing, rinse your hair and body thoroughly to remove the shampoo.  4. Use CHG as you would any other liquid soap. You can apply CHG directly to the skin and wash gently with a scrungie or a clean washcloth.  5. Apply the CHG soap to your body only from the neck down. Do not use on open wounds or open sores. Avoid contact with your eyes, ears, mouth, and genitals (private parts). Wash face and genitals (private parts) with your normal soap.  6. Wash thoroughly, paying special attention to the area where your surgery will be performed.  7. Thoroughly rinse your body with warm water.  8. Do not shower/wash with your normal soap after using and rinsing off the CHG soap.  9. Pat yourself dry with a clean towel.  10. Wear clean pajamas to bed the night before surgery.  12. Place clean sheets on your bed the night of your first shower and do not sleep with pets.  13. Shower again with the CHG soap on the day of surgery prior to arriving at the hospital.  14. Do not apply any deodorants/lotions/powders.  15. Please wear clean clothes to the hospital.

## 2022-11-15 ENCOUNTER — Encounter: Admission: RE | Payer: Self-pay | Source: Home / Self Care

## 2022-11-15 ENCOUNTER — Ambulatory Visit: Admission: RE | Admit: 2022-11-15 | Payer: 59 | Source: Home / Self Care | Admitting: General Surgery

## 2022-11-15 SURGERY — EXCISION, SIMPLE PILONIDAL CYST
Anesthesia: General | Site: Buttocks

## 2022-11-15 MED ORDER — MIDAZOLAM HCL 2 MG/2ML IJ SOLN
INTRAMUSCULAR | Status: AC
  Filled 2022-11-15: qty 2

## 2022-11-15 MED ORDER — LIDOCAINE HCL (PF) 2 % IJ SOLN
INTRAMUSCULAR | Status: AC
  Filled 2022-11-15: qty 5

## 2022-11-15 MED ORDER — DEXAMETHASONE SODIUM PHOSPHATE 10 MG/ML IJ SOLN
INTRAMUSCULAR | Status: AC
  Filled 2022-11-15: qty 1

## 2022-11-15 MED ORDER — ROCURONIUM BROMIDE 10 MG/ML (PF) SYRINGE
PREFILLED_SYRINGE | INTRAVENOUS | Status: AC
  Filled 2022-11-15: qty 10

## 2022-11-15 MED ORDER — PROPOFOL 10 MG/ML IV BOLUS
INTRAVENOUS | Status: AC
  Filled 2022-11-15: qty 20

## 2022-11-15 MED ORDER — ONDANSETRON HCL 4 MG/2ML IJ SOLN
INTRAMUSCULAR | Status: AC
  Filled 2022-11-15: qty 2

## 2022-11-15 MED ORDER — FENTANYL CITRATE (PF) 100 MCG/2ML IJ SOLN
INTRAMUSCULAR | Status: AC
  Filled 2022-11-15: qty 2
